# Patient Record
Sex: Female | Born: 2000 | Race: White | Hispanic: No | Marital: Single | Smoking: Never smoker
Health system: Southern US, Community
[De-identification: ages and names within clinical notes are randomized; demographics above are authoritative.]

## PROBLEM LIST (undated history)

## (undated) ENCOUNTER — Inpatient Hospital Stay (HOSPITAL_COMMUNITY): Payer: Self-pay

## (undated) DIAGNOSIS — F419 Anxiety disorder, unspecified: Secondary | ICD-10-CM

---
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
## 2020-04-12 ENCOUNTER — Ambulatory Visit: Payer: Self-pay

## 2020-04-14 ENCOUNTER — Ambulatory Visit: Payer: Medicaid Other | Attending: Internal Medicine

## 2020-04-14 DIAGNOSIS — Z23 Encounter for immunization: Secondary | ICD-10-CM

## 2020-04-14 NOTE — Progress Notes (Signed)
   [id number]  Name:  [facility] X.    MRN: [id number] DOB: [birth year]  04/14/2020  Brittany Roman was observed post Covid-19 immunization for 15 minutes without incident. She was provided with Vaccine Information Sheet and instruction to access the V-Safe system.   Brittany Roman was instructed to call 911 with any severe reactions post vaccine: Marland Kitchen Difficulty breathing  . Swelling of face and throat  . A fast heartbeat  . A bad rash all over body  . Dizziness and weakness   Immunizations Administered    Name Date Dose VIS Date Route   Pfizer COVID-19 Vaccine 04/14/2020 11:07 AM 0.3 mL 11/30/2018 Intramuscular   Manufacturer: [employer], [employer]   Lot: [id number]   NDC: [address]-8

## 2020-05-05 ENCOUNTER — Ambulatory Visit: Payer: Self-pay

## 2020-05-05 ENCOUNTER — Ambulatory Visit: Payer: Medicaid Other | Attending: Internal Medicine

## 2020-05-05 DIAGNOSIS — Z23 Encounter for immunization: Secondary | ICD-10-CM

## 2020-05-05 NOTE — Progress Notes (Signed)
   [id number]  Name:  [facility] X.    MRN: [id number] DOB: [birth year]  05/05/2020  [name] was observed post Covid-19 immunization for 15 minutes without incident. She was provided with Vaccine Information Sheet and instruction to access the V-Safe system.   [name] was instructed to call 911 with any severe reactions post vaccine: Marland Kitchen Difficulty breathing  . Swelling of face and throat  . A fast heartbeat  . A bad rash all over body  . Dizziness and weakness   Immunizations Administered    Name Date Dose VIS Date Route   Pfizer COVID-19 Vaccine 05/05/2020 11:08 AM 0.3 mL 11/30/2018 Intramuscular   Manufacturer: [employer], [employer]   Lot: [id number]   NDC: [address]-7

## 2021-12-01 ENCOUNTER — Ambulatory Visit (HOSPITAL_COMMUNITY): Admit: 2021-12-01 | Discharge status: Absent without leave | Payer: Self-pay

## 2021-12-01 ENCOUNTER — Ambulatory Visit (INDEPENDENT_AMBULATORY_CARE_PROVIDER_SITE_OTHER): Payer: BC Managed Care – PPO

## 2021-12-01 ENCOUNTER — Encounter (HOSPITAL_COMMUNITY): Payer: Self-pay

## 2021-12-01 ENCOUNTER — Other Ambulatory Visit: Payer: Self-pay

## 2021-12-01 ENCOUNTER — Ambulatory Visit (HOSPITAL_COMMUNITY)
Admission: EM | Admit: 2021-12-01 | Discharge: 2021-12-01 | Disposition: A | Discharge status: Home / Self Care | Payer: BC Managed Care – PPO | Attending: Family Medicine | Admitting: Family Medicine

## 2021-12-01 DIAGNOSIS — R0602 Shortness of breath: Secondary | ICD-10-CM | POA: Diagnosis not present

## 2021-12-01 DIAGNOSIS — R0600 Dyspnea, unspecified: Secondary | ICD-10-CM | POA: Diagnosis not present

## 2021-12-01 DIAGNOSIS — F419 Anxiety disorder, unspecified: Secondary | ICD-10-CM | POA: Diagnosis not present

## 2021-12-01 MED ORDER — BUSPIRONE HCL 5 MG PO TABS: 5.0000 mg | ORAL_TABLET | Freq: Every day | ORAL | 0 refills | Status: DC | PRN

## 2021-12-01 NOTE — Discharge Instructions (Addendum)
Chest x-ray was clear of any problem.  Buspirone 5 mg, 1 to 2 tablets daily as needed for anxiety.  I am glad you can see your primary care office this week, and they may start you on something different to take daily for anxiety.  I do think it would be better not to vape at all

## 2021-12-01 NOTE — ED Triage Notes (Signed)
Pt presents to the office for panic attacks and anxiety. She has a history of depression is on medication.She is worry about her breathing, as she uses vapes with her friends.  She has use hydroxyzine in the past but it makes her super sleepy.She is very tearful at this time.

## 2021-12-01 NOTE — ED Provider Notes (Signed)
[facility]    CSN: [id number] Arrival date & time: 12/01/21  1401      History   Chief Complaint Chief Complaint  Patient presents with   Anxiety    HPI Brittany Roman is a 21 y.o. female.    Anxiety  Here with recent trouble feeling like she is not getting good deep breath.  She feels that she has to concentrate on her breathing and you on a bunch to get a good deep breath.  She acknowledges that when she is busy at work and occupied, that she does not really note this.  No recent fever or chills, no cough or cold symptoms.  She has never had a history of asthma  She has been vaping a little bit more recently and she does note that that may increase her anxiety.  She has had anxiety and some panic attacks sometimes.  She already has an appointment for later this week with her primary care office  History reviewed. No pertinent past medical history.  There are no problems to display for this patient.   History reviewed. No pertinent surgical history.  OB History   No obstetric history on file.      Home Medications    Prior to Admission medications   Medication Sig Start Date End Date Taking? Authorizing Provider  busPIRone (BUSPAR) 5 MG tablet Take 1-2 tablets (5-10 mg total) by mouth daily as needed. 12/01/21  Yes Barrett Henle, MD    Family History History reviewed. No pertinent family history.  Social History Social History   Tobacco Use   Smoking status: Never     Allergies   Patient has no known allergies.   Review of Systems Review of Systems   Physical Exam Triage Vital Signs ED Triage Vitals  Enc Vitals Group     BP      Pulse      Resp      Temp      Temp src      SpO2      Weight      Height      Head Circumference      Peak Flow      Pain Score      Pain Loc      Pain Edu?      Excl. in [place]?    No data found.  Updated Vital Signs BP 99/67 (BP Location: Left Arm)    Pulse 75    Temp 98.1 F (36.7 C) (Oral)     Resp 16    LMP 12/01/2021 (Approximate)    SpO2 100%   Visual Acuity Right Eye Distance:   Left Eye Distance:   Bilateral Distance:    Right Eye Near:   Left Eye Near:    Bilateral Near:     Physical Exam Vitals reviewed.  Constitutional:      General: She is not in acute distress.    Appearance: She is not ill-appearing, toxic-appearing or diaphoretic.  HENT:     Mouth/Throat:     Mouth: Mucous membranes are moist.  Eyes:     Extraocular Movements: Extraocular movements intact.     Pupils: Pupils are equal, round, and reactive to light.  Cardiovascular:     Rate and Rhythm: Normal rate and regular rhythm.     Heart sounds: No murmur heard. Pulmonary:     Effort: Pulmonary effort is normal. No respiratory distress.     Breath sounds: Normal breath  sounds. No stridor. No wheezing, rhonchi or rales.  Musculoskeletal:     Cervical back: Neck supple.     Right lower leg: No edema.     Left lower leg: No edema.  Lymphadenopathy:     Cervical: No cervical adenopathy.  Skin:    Capillary Refill: Capillary refill takes less than 2 seconds.     Coloration: Skin is not jaundiced or pale.  Neurological:     General: No focal deficit present.     Mental Status: She is oriented to person, place, and time.  Psychiatric:     Comments: A little anxious and tearful in the exam room     UC Treatments / Results  Labs (all labs ordered are listed, but only abnormal results are displayed) Labs Reviewed - No data to display  EKG   Radiology DG Chest 2 View  Result Date: 12/01/2021 CLINICAL DATA:  Dyspnea.  Panic attacks, anxiety. EXAM: CHEST - 2 VIEW COMPARISON:  08/28/2006 FINDINGS: The heart size and mediastinal contours are within normal limits. Both lungs are clear. The visualized skeletal structures are unremarkable. IMPRESSION: No active cardiopulmonary disease. Electronically Signed   By: Nolon Nations M.D.   On: 12/01/2021 16:27    Procedures Procedures (including  critical care time)  Medications Ordered in UC Medications - No data to display  Initial Impression / Assessment and Plan / UC Course  I have reviewed the triage vital signs and the nursing notes.  Pertinent labs & imaging results that were available during my care of the patient were reviewed by me and considered in my medical decision making (see chart for details).     Chest x-ray ordered, as when I reassured her that her lung exam was normal, and asked if that was sufficient to reassure her, she asked if she could still do a chest x-ray   Chest x-ray is clear. Final Clinical Impressions(s) / UC Diagnoses   Final diagnoses:  Shortness of breath  Anxiety     Discharge Instructions      Chest x-ray was clear of any problem.  Buspirone 5 mg, 1 to 2 tablets daily as needed for anxiety.  I am glad you can see your primary care office this week, and they may start you on something different to take daily for anxiety.  I do think it would be better not to vape at all     ED Prescriptions     Medication Sig Dispense Auth. Provider   busPIRone (BUSPAR) 5 MG tablet Take 1-2 tablets (5-10 mg total) by mouth daily as needed. 20 tablet Bernard Donahoo, Gwenlyn Perking, MD      I have reviewed the PDMP during this encounter.   Barrett Henle, MD 12/01/21 [phone]

## 2021-12-02 ENCOUNTER — Encounter (HOSPITAL_COMMUNITY): Payer: Self-pay

## 2021-12-04 DIAGNOSIS — F332 Major depressive disorder, recurrent severe without psychotic features: Secondary | ICD-10-CM | POA: Diagnosis not present

## 2021-12-04 DIAGNOSIS — R4586 Emotional lability: Secondary | ICD-10-CM | POA: Diagnosis not present

## 2021-12-04 DIAGNOSIS — F411 Generalized anxiety disorder: Secondary | ICD-10-CM | POA: Diagnosis not present

## 2021-12-30 DIAGNOSIS — F319 Bipolar disorder, unspecified: Secondary | ICD-10-CM | POA: Diagnosis not present

## 2021-12-30 DIAGNOSIS — N760 Acute vaginitis: Secondary | ICD-10-CM | POA: Diagnosis not present

## 2021-12-30 DIAGNOSIS — R4586 Emotional lability: Secondary | ICD-10-CM | POA: Diagnosis not present

## 2021-12-30 DIAGNOSIS — F332 Major depressive disorder, recurrent severe without psychotic features: Secondary | ICD-10-CM | POA: Diagnosis not present

## 2022-01-07 DIAGNOSIS — F411 Generalized anxiety disorder: Secondary | ICD-10-CM | POA: Diagnosis not present

## 2022-01-07 DIAGNOSIS — F330 Major depressive disorder, recurrent, mild: Secondary | ICD-10-CM | POA: Diagnosis not present

## 2022-01-21 DIAGNOSIS — F411 Generalized anxiety disorder: Secondary | ICD-10-CM | POA: Diagnosis not present

## 2022-01-21 DIAGNOSIS — F330 Major depressive disorder, recurrent, mild: Secondary | ICD-10-CM | POA: Diagnosis not present

## 2022-01-28 DIAGNOSIS — F411 Generalized anxiety disorder: Secondary | ICD-10-CM | POA: Diagnosis not present

## 2022-01-28 DIAGNOSIS — F330 Major depressive disorder, recurrent, mild: Secondary | ICD-10-CM | POA: Diagnosis not present

## 2022-02-03 DIAGNOSIS — L700 Acne vulgaris: Secondary | ICD-10-CM | POA: Diagnosis not present

## 2022-02-04 DIAGNOSIS — F411 Generalized anxiety disorder: Secondary | ICD-10-CM | POA: Diagnosis not present

## 2022-02-04 DIAGNOSIS — F330 Major depressive disorder, recurrent, mild: Secondary | ICD-10-CM | POA: Diagnosis not present

## 2022-02-11 DIAGNOSIS — F330 Major depressive disorder, recurrent, mild: Secondary | ICD-10-CM | POA: Diagnosis not present

## 2022-02-11 DIAGNOSIS — F411 Generalized anxiety disorder: Secondary | ICD-10-CM | POA: Diagnosis not present

## 2022-02-18 DIAGNOSIS — F319 Bipolar disorder, unspecified: Secondary | ICD-10-CM | POA: Diagnosis not present

## 2022-02-18 DIAGNOSIS — N760 Acute vaginitis: Secondary | ICD-10-CM | POA: Diagnosis not present

## 2022-02-18 DIAGNOSIS — F332 Major depressive disorder, recurrent severe without psychotic features: Secondary | ICD-10-CM | POA: Diagnosis not present

## 2022-02-18 DIAGNOSIS — Z113 Encounter for screening for infections with a predominantly sexual mode of transmission: Secondary | ICD-10-CM | POA: Diagnosis not present

## 2022-02-20 DIAGNOSIS — R059 Cough, unspecified: Secondary | ICD-10-CM | POA: Diagnosis not present

## 2022-02-20 DIAGNOSIS — R051 Acute cough: Secondary | ICD-10-CM | POA: Diagnosis not present

## 2022-02-25 DIAGNOSIS — F411 Generalized anxiety disorder: Secondary | ICD-10-CM | POA: Diagnosis not present

## 2022-02-25 DIAGNOSIS — F330 Major depressive disorder, recurrent, mild: Secondary | ICD-10-CM | POA: Diagnosis not present

## 2022-03-11 DIAGNOSIS — F330 Major depressive disorder, recurrent, mild: Secondary | ICD-10-CM | POA: Diagnosis not present

## 2022-03-11 DIAGNOSIS — F411 Generalized anxiety disorder: Secondary | ICD-10-CM | POA: Diagnosis not present

## 2022-03-18 DIAGNOSIS — F411 Generalized anxiety disorder: Secondary | ICD-10-CM | POA: Diagnosis not present

## 2022-03-18 DIAGNOSIS — F330 Major depressive disorder, recurrent, mild: Secondary | ICD-10-CM | POA: Diagnosis not present

## 2022-03-25 DIAGNOSIS — F411 Generalized anxiety disorder: Secondary | ICD-10-CM | POA: Diagnosis not present

## 2022-03-25 DIAGNOSIS — F330 Major depressive disorder, recurrent, mild: Secondary | ICD-10-CM | POA: Diagnosis not present

## 2022-04-01 DIAGNOSIS — F330 Major depressive disorder, recurrent, mild: Secondary | ICD-10-CM | POA: Diagnosis not present

## 2022-04-01 DIAGNOSIS — F411 Generalized anxiety disorder: Secondary | ICD-10-CM | POA: Diagnosis not present

## 2022-04-22 DIAGNOSIS — F411 Generalized anxiety disorder: Secondary | ICD-10-CM | POA: Diagnosis not present

## 2022-04-22 DIAGNOSIS — F330 Major depressive disorder, recurrent, mild: Secondary | ICD-10-CM | POA: Diagnosis not present

## 2022-05-02 DIAGNOSIS — N926 Irregular menstruation, unspecified: Secondary | ICD-10-CM | POA: Diagnosis not present

## 2022-05-02 DIAGNOSIS — Z113 Encounter for screening for infections with a predominantly sexual mode of transmission: Secondary | ICD-10-CM | POA: Diagnosis not present

## 2022-05-06 DIAGNOSIS — F411 Generalized anxiety disorder: Secondary | ICD-10-CM | POA: Diagnosis not present

## 2022-05-06 DIAGNOSIS — F330 Major depressive disorder, recurrent, mild: Secondary | ICD-10-CM | POA: Diagnosis not present

## 2022-05-20 DIAGNOSIS — F411 Generalized anxiety disorder: Secondary | ICD-10-CM | POA: Diagnosis not present

## 2022-05-20 DIAGNOSIS — F330 Major depressive disorder, recurrent, mild: Secondary | ICD-10-CM | POA: Diagnosis not present

## 2022-07-03 DIAGNOSIS — Z01419 Encounter for gynecological examination (general) (routine) without abnormal findings: Secondary | ICD-10-CM | POA: Diagnosis not present

## 2022-07-03 DIAGNOSIS — N390 Urinary tract infection, site not specified: Secondary | ICD-10-CM | POA: Diagnosis not present

## 2022-07-03 DIAGNOSIS — Z124 Encounter for screening for malignant neoplasm of cervix: Secondary | ICD-10-CM | POA: Diagnosis not present

## 2022-07-03 DIAGNOSIS — R399 Unspecified symptoms and signs involving the genitourinary system: Secondary | ICD-10-CM | POA: Diagnosis not present

## 2022-07-03 DIAGNOSIS — R3915 Urgency of urination: Secondary | ICD-10-CM | POA: Diagnosis not present

## 2022-07-03 DIAGNOSIS — Z681 Body mass index (BMI) 19 or less, adult: Secondary | ICD-10-CM | POA: Diagnosis not present

## 2022-07-03 DIAGNOSIS — Z113 Encounter for screening for infections with a predominantly sexual mode of transmission: Secondary | ICD-10-CM | POA: Diagnosis not present

## 2022-07-08 DIAGNOSIS — F411 Generalized anxiety disorder: Secondary | ICD-10-CM | POA: Diagnosis not present

## 2022-07-08 DIAGNOSIS — F330 Major depressive disorder, recurrent, mild: Secondary | ICD-10-CM | POA: Diagnosis not present

## 2022-09-06 DIAGNOSIS — Z113 Encounter for screening for infections with a predominantly sexual mode of transmission: Secondary | ICD-10-CM | POA: Diagnosis not present

## 2022-09-18 DIAGNOSIS — D224 Melanocytic nevi of scalp and neck: Secondary | ICD-10-CM | POA: Diagnosis not present

## 2022-09-18 DIAGNOSIS — D485 Neoplasm of uncertain behavior of skin: Secondary | ICD-10-CM | POA: Diagnosis not present

## 2022-11-18 ENCOUNTER — Other Ambulatory Visit: Payer: Self-pay

## 2022-11-18 ENCOUNTER — Encounter (HOSPITAL_BASED_OUTPATIENT_CLINIC_OR_DEPARTMENT_OTHER): Payer: Self-pay

## 2022-11-18 ENCOUNTER — Emergency Department (HOSPITAL_BASED_OUTPATIENT_CLINIC_OR_DEPARTMENT_OTHER)
Admission: EM | Admit: 2022-11-18 | Discharge: 2022-11-18 | Disposition: A | Discharge status: Home / Self Care | Payer: BC Managed Care – PPO | Attending: Emergency Medicine | Admitting: Emergency Medicine

## 2022-11-18 ENCOUNTER — Emergency Department (HOSPITAL_BASED_OUTPATIENT_CLINIC_OR_DEPARTMENT_OTHER): Payer: BC Managed Care – PPO

## 2022-11-18 DIAGNOSIS — W19XXXA Unspecified fall, initial encounter: Secondary | ICD-10-CM

## 2022-11-18 DIAGNOSIS — W01198A Fall on same level from slipping, tripping and stumbling with subsequent striking against other object, initial encounter: Secondary | ICD-10-CM | POA: Insufficient documentation

## 2022-11-18 DIAGNOSIS — Y9301 Activity, walking, marching and hiking: Secondary | ICD-10-CM | POA: Diagnosis not present

## 2022-11-18 DIAGNOSIS — R519 Headache, unspecified: Secondary | ICD-10-CM | POA: Diagnosis not present

## 2022-11-18 DIAGNOSIS — S0081XA Abrasion of other part of head, initial encounter: Secondary | ICD-10-CM

## 2022-11-18 DIAGNOSIS — S0990XA Unspecified injury of head, initial encounter: Secondary | ICD-10-CM | POA: Diagnosis not present

## 2022-11-18 DIAGNOSIS — S01112A Laceration without foreign body of left eyelid and periocular area, initial encounter: Secondary | ICD-10-CM | POA: Insufficient documentation

## 2022-11-18 DIAGNOSIS — S0181XA Laceration without foreign body of other part of head, initial encounter: Secondary | ICD-10-CM

## 2022-11-18 DIAGNOSIS — S0083XA Contusion of other part of head, initial encounter: Secondary | ICD-10-CM | POA: Diagnosis not present

## 2022-11-18 MED ORDER — ACETAMINOPHEN 500 MG PO TABS
1000.0000 mg | ORAL_TABLET | Freq: Once | ORAL | Status: AC
  Administered 2022-11-18: 1000 mg via ORAL
  Filled 2022-11-18: qty 2

## 2022-11-18 MED ORDER — LIDOCAINE-EPINEPHRINE 2 %-1:200000 IJ SOLN
10.0000 mL | Freq: Once | INTRAMUSCULAR | Status: AC
Start: 2022-11-18 — End: 2022-11-18
  Administered 2022-11-18: 10 mL
  Filled 2022-11-18: qty 20

## 2022-11-18 NOTE — ED Notes (Signed)
Wound irrigated with NS and cleaned with wound cleanser.

## 2022-11-18 NOTE — ED Triage Notes (Addendum)
Patient here POV from Home.  Endorses Fall last PM. Patient states she is unsure how she fell. No LOC. No Anticoagulants. States she fell onto Concrete. Left Sided Facial Bruising and Swelling. States this occurred Last PM. No Neck Pain or C-Spine Tenderness.  Tearful in Triage. A&Ox4. GCS 15. Ambulatory.

## 2022-11-18 NOTE — ED Notes (Signed)
Pt given discharge instructions. Opportunities given for questions. Pt verbalizes understanding. Leanne Chang, RN

## 2022-11-18 NOTE — Discharge Instructions (Addendum)
Your CT imaging of the head and face was negative for acute intracranial bleeding, skull fracture or facial fracture.  You sustained a 1 cm laceration to the left eyebrow with associated abrasions to the left cheek.  He also sustained hematoma/bruising to the left eyelid and surrounding the left eye.  Your vision is intact.  Recommend cleaning your wounds with warm soapy water daily, can apply Neosporin or bacitracin ointment to the affected areas.  Your laceration was closed with absorbable sutures, these will fall out or be absorbed into the tissue slowly over time.  Recommend Tylenol and ibuprofen for pain control.

## 2022-11-18 NOTE — ED Provider Notes (Signed)
[facility] Provider Note   CSN: [id number] Arrival date & time: 11/18/22  1103     History  Chief Complaint  Patient presents with   Head Injury    Brittany Roman is a 22 y.o. female.   Head Injury    22 year old female with no significant medical history who presents to the emergency department after a fall.  The patient states that she had a drink with dinner and tripped while walking home and landed on her face on concrete.  She denies any loss of consciousness.  She is not on anticoagulation.  She sustained a facial abrasion and bruising around her left eye.  She states that her tetanus is up-to-date and last administration was documented as in 2020.  She denies any vision loss, blurry vision, eye pain or ocular foreign body sensation.  She denies being assaulted.  She states that she feels safe at home.  She denies any other injuries other than to the left side of her face.  She states that she did have 1 episode of NBNB emesis last night, none since.  She endorses pain around her left orbit.  Ice to the emergency department GCS 15, ABC intact complaining of moderate to severe left facial pain.  Home Medications Prior to Admission medications   Medication Sig Start Date End Date Taking? Authorizing Provider  busPIRone (BUSPAR) 5 MG tablet Take 1-2 tablets (5-10 mg total) by mouth daily as needed. 12/01/21   Barrett Henle, MD      Allergies    Patient has no known allergies.    Review of Systems   Review of Systems  All other systems reviewed and are negative.   Physical Exam Updated Vital Signs BP (!) 116/92 (BP Location: Right Arm)   Pulse 94   Temp 97.8 F (36.6 C)   Resp 18   Ht 5' 3"$  (1.6 m)   Wt 48.5 kg   SpO2 100%   BMI 18.95 kg/m  Physical Exam Vitals and nursing note reviewed.  Constitutional:      General: She is not in acute distress.    Appearance: She is well-developed.     Comments: GCS 15, ABC intact   HENT:     Head: Normocephalic.     Comments: Left-sided periorbital swelling, hematoma of the eyelid and periorbital hematoma noted, tenderness to palpation about the orbit along the lateral aspect of the left eye.   Left-sided facial/cheek abrasion present, hemostatic.   1cm laceration to the left eyebrow, hemostatic Eyes:     General: Vision grossly intact. Gaze aligned appropriately.     Extraocular Movements: Extraocular movements intact.     Conjunctiva/sclera: Conjunctivae normal.     Pupils: Pupils are equal, round, and reactive to light.  Neck:     Comments: No midline tenderness to palpation of the cervical spine.  Range of motion intact Cardiovascular:     Rate and Rhythm: Normal rate and regular rhythm.  Pulmonary:     Effort: Pulmonary effort is normal. No respiratory distress.     Breath sounds: Normal breath sounds.  Chest:     Comments: Clavicles stable nontender to AP compression.  Chest wall stable and nontender to AP and lateral compression. Abdominal:     Palpations: Abdomen is soft.     Tenderness: There is no abdominal tenderness.     Comments: Pelvis stable to lateral compression  Musculoskeletal:     Cervical back: Neck supple.  Comments: No midline tenderness to palpation of the thoracic or lumbar spine.  Extremities atraumatic with intact range of motion  Skin:    General: Skin is warm and dry.  Neurological:     Mental Status: She is alert.     Comments: Cranial nerves II through XII grossly intact.  Moving all 4 extremities spontaneously.  Sensation grossly intact all 4 extremities     ED Results / Procedures / Treatments   Labs (all labs ordered are listed, but only abnormal results are displayed) Labs Reviewed  PREGNANCY, URINE    EKG None  Radiology CT Head Wo Contrast  Result Date: 11/18/2022 CLINICAL DATA:  Fall with facial bruising and swelling as well as head pain. EXAM: CT HEAD WITHOUT CONTRAST CT MAXILLOFACIAL WITHOUT CONTRAST  TECHNIQUE: Multidetector CT imaging of the head and maxillofacial structures were performed using the standard protocol without intravenous contrast. Multiplanar CT image reconstructions of the maxillofacial structures were also generated. RADIATION DOSE REDUCTION: This exam was performed according to the departmental dose-optimization program which includes automated exposure control, adjustment of the mA and/or kV according to patient size and/or use of iterative reconstruction technique. COMPARISON:  None Available. FINDINGS: CT HEAD FINDINGS Brain: No evidence of acute infarction, hemorrhage, hydrocephalus, extra-axial collection or mass lesion/mass effect. Vascular: No hyperdense vessel or unexpected calcification. Skull: Normal. Negative for fracture or focal lesion. Other: None. CT MAXILLOFACIAL FINDINGS Osseous: No fracture or mandibular dislocation. No destructive process. Orbits: Negative. No traumatic or inflammatory finding. Sinuses: There is a small amount of left maxillary sinus disease. Soft tissues: There is a small amount of soft tissue swelling overlying the left aspect of the face. IMPRESSION: 1. No acute intracranial process. 2. No acute facial bone fracture. Electronically Signed   By: Zerita Boers M.D.   On: 11/18/2022 12:01   CT Maxillofacial Wo Contrast  Result Date: 11/18/2022 CLINICAL DATA:  Fall with facial bruising and swelling as well as head pain. EXAM: CT HEAD WITHOUT CONTRAST CT MAXILLOFACIAL WITHOUT CONTRAST TECHNIQUE: Multidetector CT imaging of the head and maxillofacial structures were performed using the standard protocol without intravenous contrast. Multiplanar CT image reconstructions of the maxillofacial structures were also generated. RADIATION DOSE REDUCTION: This exam was performed according to the departmental dose-optimization program which includes automated exposure control, adjustment of the mA and/or kV according to patient size and/or use of iterative  reconstruction technique. COMPARISON:  None Available. FINDINGS: CT HEAD FINDINGS Brain: No evidence of acute infarction, hemorrhage, hydrocephalus, extra-axial collection or mass lesion/mass effect. Vascular: No hyperdense vessel or unexpected calcification. Skull: Normal. Negative for fracture or focal lesion. Other: None. CT MAXILLOFACIAL FINDINGS Osseous: No fracture or mandibular dislocation. No destructive process. Orbits: Negative. No traumatic or inflammatory finding. Sinuses: There is a small amount of left maxillary sinus disease. Soft tissues: There is a small amount of soft tissue swelling overlying the left aspect of the face. IMPRESSION: 1. No acute intracranial process. 2. No acute facial bone fracture. Electronically Signed   By: Zerita Boers M.D.   On: 11/18/2022 12:01    Procedures .Marland KitchenLaceration Repair  Date/Time: 11/18/2022 1:10 PM  Performed by: Regan Lemming, MD Authorized by: Regan Lemming, MD   Consent:    Consent obtained:  Verbal   Consent given by:  Patient   Risks discussed:  Infection, poor cosmetic result, need for additional repair and pain   Alternatives discussed:  No treatment Universal protocol:    Patient identity confirmed:  Verbally with patient Anesthesia:  Anesthesia method:  Local infiltration   Local anesthetic:  Lidocaine 1% WITH epi Laceration details:    Location:  Face   Face location:  L eyebrow   Length (cm):  1   Depth (mm):  2 Pre-procedure details:    Preparation:  Imaging obtained to evaluate for foreign bodies Exploration:    Imaging outcome: foreign body not noted   Treatment:    Area cleansed with:  Saline   Amount of cleaning:  Standard   Irrigation solution:  Sterile saline Skin repair:    Repair method:  Sutures   Suture size:  5-0   Wound skin closure material used: Vicryl Rapide.   Suture technique:  Simple interrupted   Number of sutures:  3 Approximation:    Approximation:  Close Repair type:    Repair type:   Simple Post-procedure details:    Dressing:  Open (no dressing)   Procedure completion:  Tolerated     Medications Ordered in ED Medications  lidocaine-EPINEPHrine (XYLOCAINE W/EPI) 2 %-1:200000 (PF) injection 10 mL (has no administration in time range)  acetaminophen (TYLENOL) tablet 1,000 mg (1,000 mg Oral Given 11/18/22 1153)    ED Course/ Medical Decision Making/ A&P                             Medical Decision Making Amount and/or Complexity of Data Reviewed Labs: ordered. Radiology: ordered.  Risk OTC drugs.    22 year old female with no significant medical history who presents to the emergency department after a fall.  The patient states that she had a drink with dinner and tripped while walking home and landed on her face on concrete.  She denies any loss of consciousness.  She is not on anticoagulation.  She sustained a facial abrasion and bruising around her left eye.  She states that her tetanus is up-to-date and last administration was documented as in 2020.  She denies any vision loss, blurry vision, eye pain or ocular foreign body sensation.  She denies being assaulted.  She states that she feels safe at home.  She denies any other injuries other than to the left side of her face.  She states that she did have 1 episode of NBNB emesis last night, none since.  She endorses pain around her left orbit.  Ice to the emergency department GCS 15, ABC intact complaining of moderate to severe left facial pain.  On arrival, the patient was vitally stable, presenting with left-sided facial trauma after a ground-level mechanical fall landing onto concrete.  1 episode of NBNB emesis.  Patient is since returned to her baseline neurologic status.  Her C-spine was cleared by Nexus criteria.  Her tetanus is up-to-date.  Her wounds were cleaned by nursing staff bedside.  CT imaging of the head and face were performed and results indicate: IMPRESSION:  1. No acute intracranial process.  2.  No acute facial bone fracture.    Her facial wounds were cleaned by nursing staff bedside.  She was provided with wound care instructions. She sustained a small 1cm laceration to the left eyebrow.  This was repaired with 5-0 Vicryl rapide sutures per the procedure note above.  Patient was provided with wound care instructions and infectious return precautions.  Final Clinical Impression(s) / ED Diagnoses Final diagnoses:  Fall, initial encounter  Abrasion of face, initial encounter  Facial laceration, initial encounter    Rx / DC Orders ED Discharge Orders  None         Regan Lemming, MD 11/18/22 1315

## 2023-02-24 DIAGNOSIS — N898 Other specified noninflammatory disorders of vagina: Secondary | ICD-10-CM | POA: Diagnosis not present

## 2023-02-24 DIAGNOSIS — N760 Acute vaginitis: Secondary | ICD-10-CM | POA: Diagnosis not present

## 2023-02-24 DIAGNOSIS — Z113 Encounter for screening for infections with a predominantly sexual mode of transmission: Secondary | ICD-10-CM | POA: Diagnosis not present

## 2023-02-24 DIAGNOSIS — B9689 Other specified bacterial agents as the cause of diseases classified elsewhere: Secondary | ICD-10-CM | POA: Diagnosis not present

## 2023-02-24 DIAGNOSIS — Z7251 High risk heterosexual behavior: Secondary | ICD-10-CM | POA: Diagnosis not present

## 2023-03-03 IMAGING — DX DG CHEST 2V
2 series · 2 of 2 positions shown · non-contrast
Comparison: 08/28/2006

CLINICAL DATA: Dyspnea.  Panic attacks, anxiety.

EXAM:
CHEST - 2 VIEW

[chest pa]
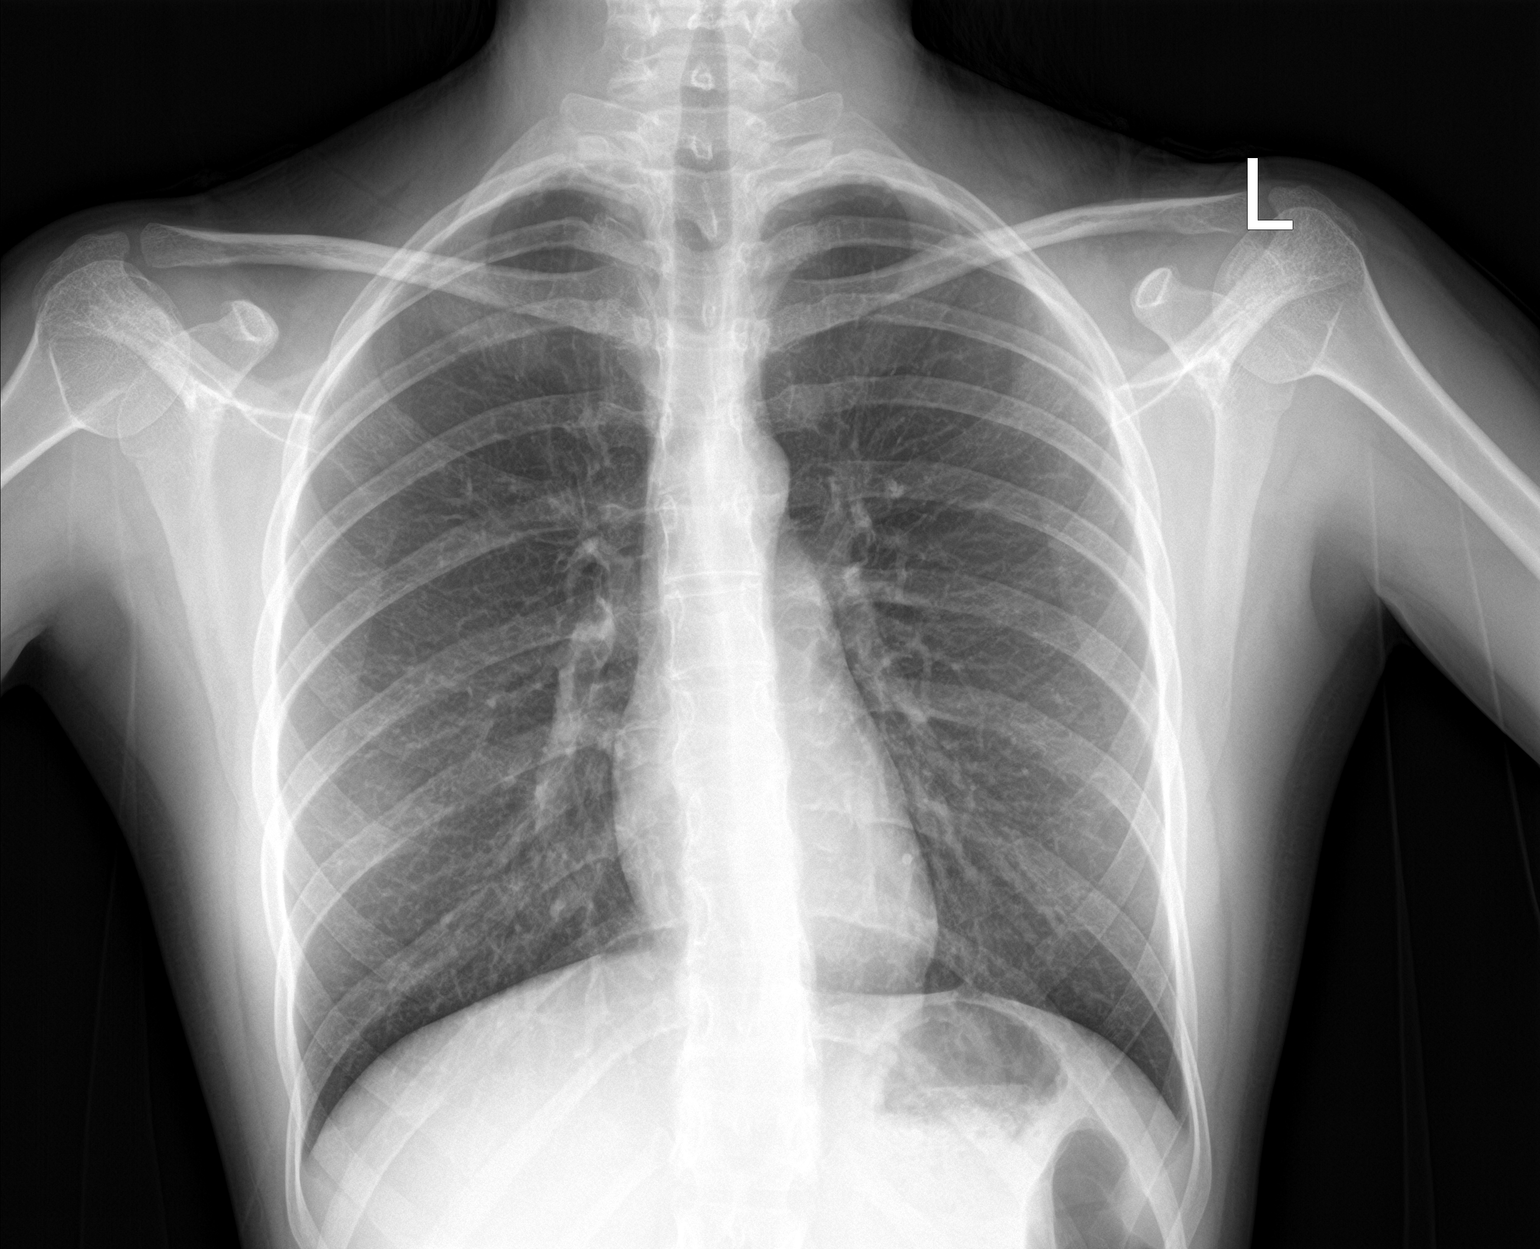

[chest lat]
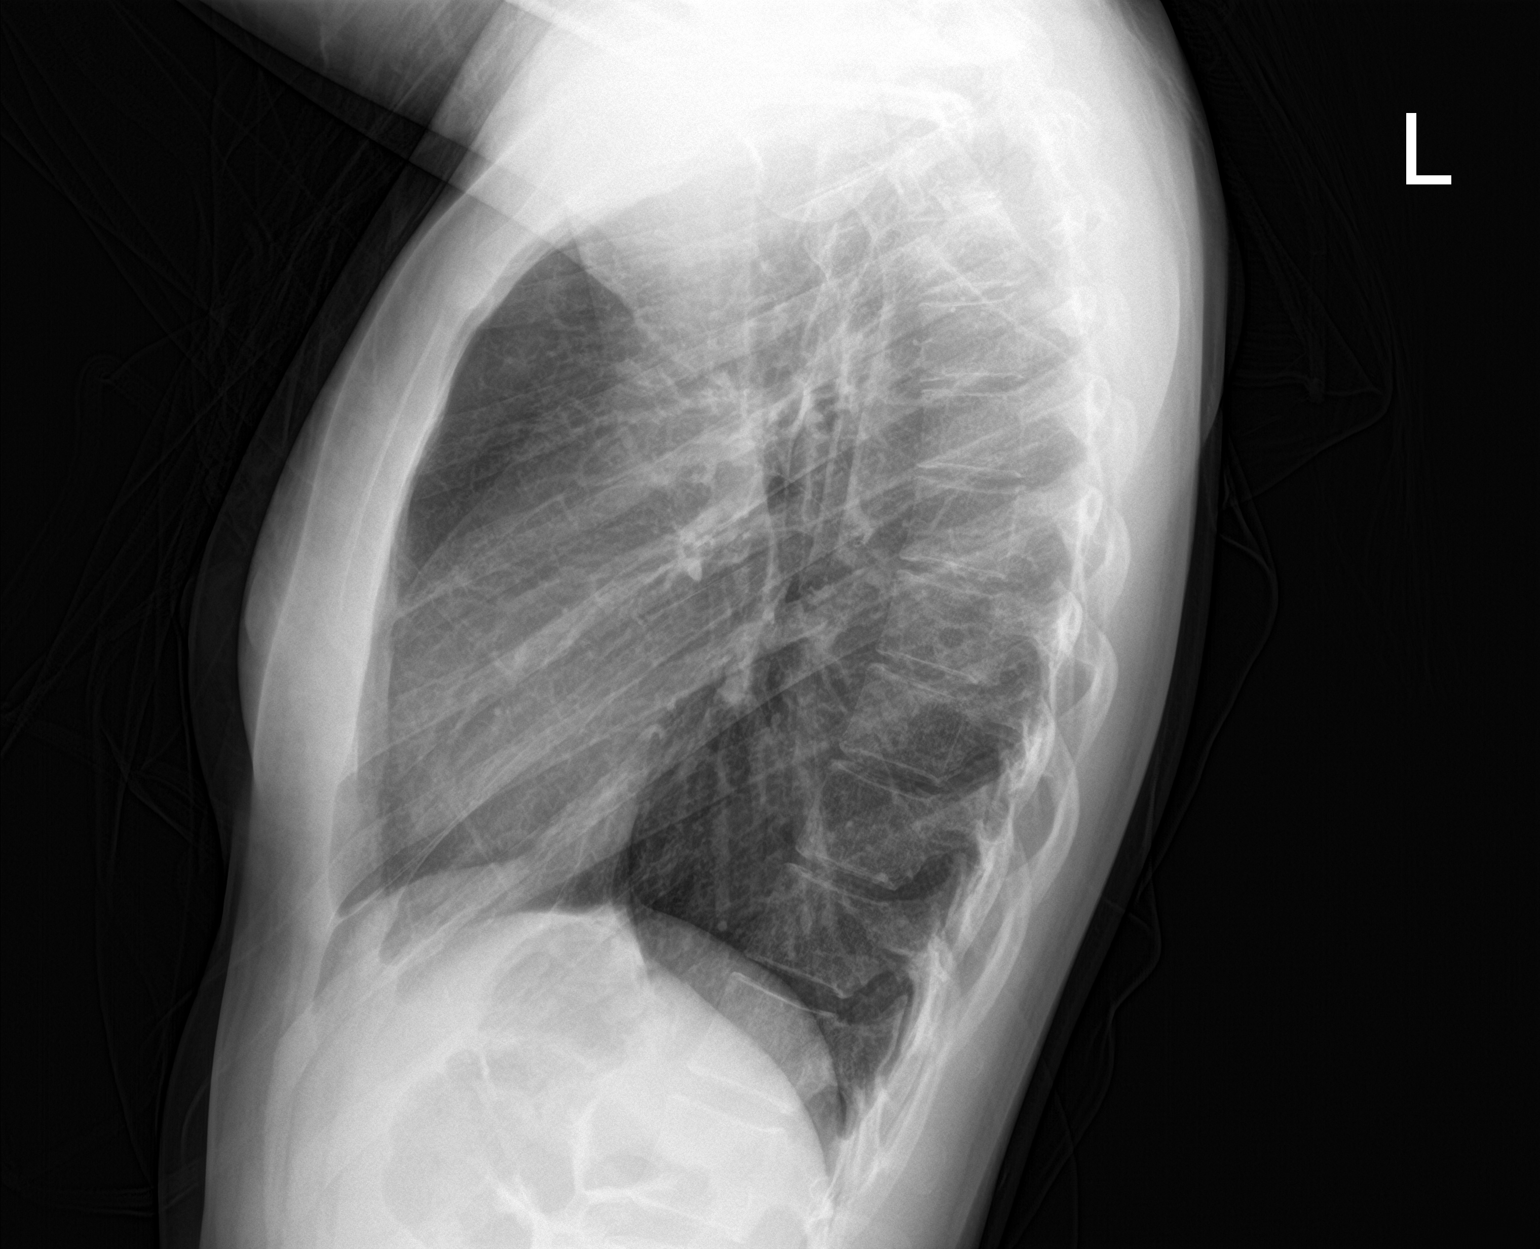

[2 of 2 positions shown; findings below may reference images not displayed]

FINDINGS: The heart size and mediastinal contours are within normal limits.
Both lungs are clear. The visualized skeletal structures are
unremarkable.
IMPRESSION: No active cardiopulmonary disease.

## 2023-04-05 DIAGNOSIS — N3001 Acute cystitis with hematuria: Secondary | ICD-10-CM | POA: Diagnosis not present

## 2023-07-20 DIAGNOSIS — Z113 Encounter for screening for infections with a predominantly sexual mode of transmission: Secondary | ICD-10-CM | POA: Diagnosis not present

## 2023-07-20 DIAGNOSIS — Z1330 Encounter for screening examination for mental health and behavioral disorders, unspecified: Secondary | ICD-10-CM | POA: Diagnosis not present

## 2023-07-20 DIAGNOSIS — Z01419 Encounter for gynecological examination (general) (routine) without abnormal findings: Secondary | ICD-10-CM | POA: Diagnosis not present

## 2023-07-20 DIAGNOSIS — R87612 Low grade squamous intraepithelial lesion on cytologic smear of cervix (LGSIL): Secondary | ICD-10-CM | POA: Diagnosis not present

## 2023-08-24 DIAGNOSIS — R87611 Atypical squamous cells cannot exclude high grade squamous intraepithelial lesion on cytologic smear of cervix (ASC-H): Secondary | ICD-10-CM | POA: Diagnosis not present

## 2023-10-07 NOTE — L&D Delivery Note (Signed)
 [facility] Delivery Note  Brittany Roman is a 23 y.o. G2P0010 s/p SVD at [redacted]w[redacted]d. She was admitted for SOL.   ROM: 3h 72m with clear fluid GBS Status: Negative/-- (10/01 1020)  Maximum Maternal Temperature: 100.2  Labor Progress: Initial SVE: 2/60/-2. She then progressed to complete.   Delivery Date/Time: 08/07/2024 2:39 PM Delivery: Called to room and patient was complete and pushing. Head delivered straight occiput anterior. nuchal cord absent. Shoulder and body delivered in usual fashion. Infant with spontaneous cry, placed on mother's abdomen, dried and stimulated. Cord clamped x 2 after 1-minute delay, and cut by patient's mother. Cord blood drawn. Placenta delivered spontaneously with gentle cord traction. Fundus firm with massage and Pitocin. Labia, perineum, vagina, and cervix inspected and repaired.  Placenta:  spontaneous Intact Complications:maternal fever 100.47F after delivery, no abx given Lacerations: 1st degree and labial QBL: Analgesia: Epidural  Newborn Data Living status:Living Gender:Female Apgars:8 ,9  Weight:3490 g           Barabara Maier, DO [facility]

## 2023-10-21 ENCOUNTER — Emergency Department (HOSPITAL_BASED_OUTPATIENT_CLINIC_OR_DEPARTMENT_OTHER)
Admission: EM | Admit: 2023-10-21 | Discharge: 2023-10-21 | Discharge status: Against Medical Advice | Payer: BC Managed Care – PPO | Attending: Emergency Medicine | Admitting: Emergency Medicine

## 2023-10-21 ENCOUNTER — Encounter (HOSPITAL_BASED_OUTPATIENT_CLINIC_OR_DEPARTMENT_OTHER): Payer: Self-pay | Admitting: *Deleted

## 2023-10-21 ENCOUNTER — Inpatient Hospital Stay (HOSPITAL_COMMUNITY)
Admission: AD | Admit: 2023-10-21 | Discharge: 2023-10-21 | Disposition: A | Discharge status: Home / Self Care | Payer: BC Managed Care – PPO | Attending: Obstetrics & Gynecology | Admitting: Obstetrics & Gynecology

## 2023-10-21 ENCOUNTER — Encounter (HOSPITAL_COMMUNITY): Payer: Self-pay | Admitting: Obstetrics & Gynecology

## 2023-10-21 ENCOUNTER — Other Ambulatory Visit: Payer: Self-pay

## 2023-10-21 DIAGNOSIS — Z3A00 Weeks of gestation of pregnancy not specified: Secondary | ICD-10-CM | POA: Insufficient documentation

## 2023-10-21 DIAGNOSIS — O3680X0 Pregnancy with inconclusive fetal viability, not applicable or unspecified: Secondary | ICD-10-CM | POA: Insufficient documentation

## 2023-10-21 DIAGNOSIS — O209 Hemorrhage in early pregnancy, unspecified: Secondary | ICD-10-CM | POA: Diagnosis not present

## 2023-10-21 DIAGNOSIS — N939 Abnormal uterine and vaginal bleeding, unspecified: Secondary | ICD-10-CM

## 2023-10-21 DIAGNOSIS — R109 Unspecified abdominal pain: Secondary | ICD-10-CM | POA: Insufficient documentation

## 2023-10-21 DIAGNOSIS — R102 Pelvic and perineal pain: Secondary | ICD-10-CM

## 2023-10-21 DIAGNOSIS — Z5321 Procedure and treatment not carried out due to patient leaving prior to being seen by health care provider: Secondary | ICD-10-CM | POA: Insufficient documentation

## 2023-10-21 DIAGNOSIS — O26899 Other specified pregnancy related conditions, unspecified trimester: Secondary | ICD-10-CM | POA: Insufficient documentation

## 2023-10-21 DIAGNOSIS — O26891 Other specified pregnancy related conditions, first trimester: Secondary | ICD-10-CM | POA: Diagnosis not present

## 2023-10-21 HISTORY — DX: Anxiety disorder, unspecified: F41.9

## 2023-10-21 LAB — CBC
HCT: 38.5 % (ref 36.0–46.0)
Hemoglobin: 13 g/dL (ref 12.0–15.0)
MCH: 30.3 pg (ref 26.0–34.0)
MCHC: 33.8 g/dL (ref 30.0–36.0)
MCV: 89.7 fL (ref 80.0–100.0)
Platelets: 312 10*3/uL (ref 150–400)
RBC: 4.29 MIL/uL (ref 3.87–5.11)
RDW: 11.9 % (ref 11.5–15.5)
WBC: 7.9 10*3/uL (ref 4.0–10.5)
nRBC: 0 % (ref 0.0–0.2)

## 2023-10-21 LAB — POCT PREGNANCY, URINE: Preg Test, Ur: NEGATIVE

## 2023-10-21 LAB — PREGNANCY, URINE: Preg Test, Ur: NEGATIVE

## 2023-10-21 LAB — HCG, QUANTITATIVE, PREGNANCY: hCG, Beta Chain, Quant, S: 6 m[IU]/mL — ABNORMAL HIGH (ref <5)

## 2023-10-21 MED ORDER — PREPLUS 27-1 MG PO TABS: 1.0000 | ORAL_TABLET | Freq: Every day | ORAL | 13 refills | Status: DC

## 2023-10-21 NOTE — MAU Provider Note (Signed)
VB, cramping  S Ms. Macaylah Bater is a 23 y.o. G1P0 pregnant female at Unknown who presents to MAU today with complaint of VB and cramping earlier today. She states the VB is just spotting on toilet paper and cramping is very light.  However she states over two week ago she had 3 positive HPTs.  Had no concerns until the bleeding today.  Pt appears to be a great historian.   Pertinent items noted in HPI and remainder of comprehensive ROS otherwise negative.   O BP (!) 102/55 (BP Location: Right Arm)   Pulse 70   Temp 97.9 F (36.6 C) (Oral)   Resp 18   Ht 5\' 3"  (1.6 m)   Wt 46.4 kg   LMP 09/12/2023 (Exact Date)   SpO2 100%   Breastfeeding Unknown   BMI 18.12 kg/m  Physical Exam Vitals and nursing note reviewed.  Constitutional:      General: She is not in acute distress.    Appearance: She is well-developed and normal weight. She is not ill-appearing.  HENT:     Head: Normocephalic and atraumatic.  Eyes:     Extraocular Movements: Extraocular movements intact.  Cardiovascular:     Rate and Rhythm: Normal rate.  Pulmonary:     Effort: Pulmonary effort is normal. No respiratory distress.  Abdominal:     General: Abdomen is flat. There is no distension.     Palpations: Abdomen is soft.     Tenderness: There is no abdominal tenderness.  Skin:    General: Skin is warm and dry.  Neurological:     Mental Status: She is alert and oriented to person, place, and time.     Motor: No weakness.  Psychiatric:        Mood and Affect: Mood is anxious.        Behavior: Behavior normal.      MDM: low  MAU Course:  Upreg negative with serum bHCG only 6.  Discussed with patient hormone level too low to see on [place] and that it is concerning that positive Upregs over 2 weeks ago with now serum only showing [redacted] week gestation.  We stated that we still cannot r/o miscarriage vs early pregnancy of unknown location.  Pt was understanding and agreeable with plan to d/c with strict bleeding and pain  return precautions with repeat serum levels on 1/17 anytime after 1900.  They have no other questions or concerns at this time.  Pt stable for d/c.   A/P #Pregnancy of unknown location - concern for miscarriage vs. Early pregnancy  - continue prenatal vitamins  - return 1/17 after 1900 for repeat beta-hCG  - pelvic rest 72hrs   Discharge from MAU in stable condition with strict/usual precautions Follow up at desired OBGYN as scheduled for ongoing prenatal care  Allergies as of 10/21/2023   No Known Allergies      Medication List     TAKE these medications    busPIRone 5 MG tablet Commonly known as: BUSPAR Take 1-2 tablets (5-10 mg total) by mouth daily as needed.   PrePLUS 27-1 MG Tabs Take 1 tablet by mouth daily.        Hessie Dibble, MD 10/21/2023 7:03 PM

## 2023-10-21 NOTE — ED Notes (Signed)
 Additional tubes sent to lab.  Urine cup given and pt advised that urine sample is needed

## 2023-10-21 NOTE — ED Triage Notes (Signed)
 Pt reports that she had a postive pregnancy test last week and today she had some mild cramping and some vaginal bleeding which she states was spotting  Pt reports LMP was 12/7.  Pt reports this is her first pregancy, no distress in triage.  Slight cramping (less than period cramping).

## 2023-10-21 NOTE — MAU Note (Signed)
 Brittany Roman is a 23 y.o. at Unknown here in MAU reporting: she had three positive HPT and began having VB and cramping that began today @ 1300.  Reports VB noted with wiping, not wearing pad/panty liner.  LMP: 09/12/2023 Onset of complaint: today Pain score: 2 Vitals:   10/21/23 1725  BP: (!) 102/55  Pulse: 70  Resp: 18  Temp: 97.9 F (36.6 C)  SpO2: 100%     FHT:NA Lab orders placed from triage: UPT

## 2023-10-22 ENCOUNTER — Inpatient Hospital Stay (HOSPITAL_COMMUNITY)
Admission: AD | Admit: 2023-10-22 | Discharge: 2023-10-22 | Disposition: A | Discharge status: Home / Self Care | Payer: BC Managed Care – PPO | Attending: Obstetrics and Gynecology | Admitting: Obstetrics and Gynecology

## 2023-10-22 DIAGNOSIS — O039 Complete or unspecified spontaneous abortion without complication: Secondary | ICD-10-CM | POA: Insufficient documentation

## 2023-10-22 DIAGNOSIS — Z3202 Encounter for pregnancy test, result negative: Secondary | ICD-10-CM | POA: Diagnosis not present

## 2023-10-22 LAB — ABO/RH: ABO/RH(D): A POS

## 2023-10-22 LAB — HCG, QUANTITATIVE, PREGNANCY: hCG, Beta Chain, Quant, S: 4 m[IU]/mL (ref <5)

## 2023-10-22 NOTE — MAU Provider Note (Signed)
History   Chief Complaint:  Vaginal Bleeding   Brittany Roman is a 23 y.o. G1P0 at Unknown who presents for labs. Reports positive UPT 3 weeks ago. Had vaginal bleeding the last few days. Presented to MAU yesterday & HCG was 6. Reports increase in bleeding today with some clots & cramping. Not saturating pads. Has been wearing panty liners.   Physical Exam   Blood pressure 107/66, pulse 80, temperature 98 F (36.7 C), resp. rate 18, last menstrual period 09/12/2023, unknown if currently breastfeeding.  Physical Examination: General appearance - alert, well appearing, and in no distress Mental status - alert, oriented to person, place, and time Eyes - pupils equal and reactive, extraocular eye movements intact, sclera anicteric Chest - normal respiratory effort  Labs: Results for orders placed or performed during the hospital encounter of 10/22/23 (from the past 24 hours)  hCG, quantitative, pregnancy   Collection Time: 10/22/23 11:00 AM  Result Value Ref Range   hCG, Beta Chain, Quant, S 4 <5 mIU/mL  ABO/Rh   Collection Time: 10/22/23  1:17 PM  Result Value Ref Range   ABO/RH(D) A POS    No rh immune globuloin      NOT A RH IMMUNE GLOBULIN CANDIDATE, PT RH POSITIVE Performed at [facility], [address], [place], [place] [address]   Results for orders placed or performed during the hospital encounter of 10/21/23 (from the past 24 hours)  Pregnancy, urine POC   Collection Time: 10/21/23  5:02 PM  Result Value Ref Range   Preg Test, Ur NEGATIVE NEGATIVE  hCG, quantitative, pregnancy   Collection Time: 10/21/23  5:42 PM  Result Value Ref Range   hCG, Beta Chain, Quant, S 6 (H) <5 mIU/mL    Ultrasound Studies:   No results found.  Assessment:   1. Negative pregnancy test   2. Early pregnancy loss     -HCG now negative. Discussed this is likely early pregnancy loss. She has f/u with [facility] later this month.  -RH positive   Plan: -Discharge home  in stable condition -Bleeding precautions discussed -Patient advised to follow-up with [facility]   Brittany Horn, NP 10/22/2023, 2:17 PM

## 2023-10-22 NOTE — MAU Note (Signed)
.  Brittany Roman is a 23 y.o. at Unknown here in MAU reporting: reports increased bleeding saturated a panty liner and mild cramping .  LMP:  Onset of complaint: today Pain score: 1 Vitals:   10/22/23 1044  BP: 107/66  Pulse: 80  Resp: 18  Temp: 98 F (36.7 C)     FHT:n/a Lab orders placed from triage: BHCG

## 2023-11-11 DIAGNOSIS — Z13220 Encounter for screening for lipoid disorders: Secondary | ICD-10-CM | POA: Diagnosis not present

## 2023-11-11 DIAGNOSIS — Z0000 Encounter for general adult medical examination without abnormal findings: Secondary | ICD-10-CM | POA: Diagnosis not present

## 2023-11-11 DIAGNOSIS — Z23 Encounter for immunization: Secondary | ICD-10-CM | POA: Diagnosis not present

## 2023-12-07 ENCOUNTER — Other Ambulatory Visit: Payer: Self-pay

## 2023-12-07 ENCOUNTER — Ambulatory Visit (INDEPENDENT_AMBULATORY_CARE_PROVIDER_SITE_OTHER): Payer: BC Managed Care – PPO | Admitting: *Deleted

## 2023-12-07 ENCOUNTER — Encounter: Payer: Self-pay | Admitting: *Deleted

## 2023-12-07 VITALS — BP 110/68 | HR 82 | Ht 63.0 in | Wt 105.0 lb

## 2023-12-07 DIAGNOSIS — Z3687 Encounter for antenatal screening for uncertain dates: Secondary | ICD-10-CM

## 2023-12-07 DIAGNOSIS — O3680X0 Pregnancy with inconclusive fetal viability, not applicable or unspecified: Secondary | ICD-10-CM

## 2023-12-07 DIAGNOSIS — Z3201 Encounter for pregnancy test, result positive: Secondary | ICD-10-CM | POA: Diagnosis not present

## 2023-12-07 LAB — POCT PREGNANCY, URINE: Preg Test, Ur: POSITIVE — AB

## 2023-12-07 NOTE — Patient Instructions (Addendum)
 Prenatal Care Providers           Center for [facility] @ [facility]  [address] [phone]  Center for [facility] @ [facility]   [address]  [phone]  Center For [facility] @ [facility]       [address] [phone]            Center for [facility] @ [place]     [phone]          Center for [facility] @ [facility]   [address] [phone]  Center for [facility] @ [facility]  [address] [phone]     Center for [facility] @ [address] [place] Ace)  [address]   [phone]     [facility]  Phone: [phone]  [facility]  Phone: [phone]  [facility] Phone: [phone]  [facility] Phone: [phone]  [facility] Phone: [phone]  [facility] Phone: [phone]  [facility]  Phone: [phone]   Eating Plan for Pregnant Women While you are pregnant, your body requires additional nutrition to help support your growing baby. You also have a higher need for some vitamins and minerals, such as folic acid, calcium, iron, and vitamin D. Eating a healthy, well-balanced diet is very important for your health and your baby's health. Your need for extra calories varies over the course of your pregnancy. Pregnancy is divided into three trimesters, with each trimester lasting 3 months. For most women, it is recommended to consume: 150 extra calories a day during the first trimester. 300 extra calories a day during the second trimester. 300 extra calories a day during the third trimester. What are tips for following this plan? Cooking Practice good food safety and cleanliness. Wash your hands before you eat and after you prepare raw meat. Wash all fruits and vegetables well before peeling or  eating. Taking these actions can help to prevent foodborne illnesses that can be very dangerous to your baby, such as listeriosis. Ask your health care provider for more information about listeriosis. Make sure that all meats, poultry, and eggs are cooked to food-safe temperatures or "well-done." Meal planning  Eat a variety of foods (especially fruits and vegetables) to get a full range of vitamins and minerals. Two or more servings of fish are recommended each week in order to get the most benefits from omega-3 fatty acids that are found in seafood. Choose fish that are lower in mercury, such as salmon and pollock. Limit your overall intake of foods that have "empty calories." These are foods that have little nutritional value, such as sweets, desserts, candies, and sugar-sweetened beverages. Drinks that contain caffeine are okay to drink, but it is better to avoid caffeine. Keep your total caffeine intake to less than 200 mg each day (which is 12 oz or 355 mL of coffee, tea, or soda) or the limit as told by your health care provider. General information Do not try to lose weight or go on a diet during pregnancy. Take a prenatal vitamin to help meet your additional vitamin and mineral needs during pregnancy, specifically for folic acid, iron, calcium, and vitamin D. Remember to stay active. Ask your health care provider what types of exercise and activities are safe for you. What does 150 extra calories look like? Healthy options that provide 150 extra  calories each day could be any of the following: 6-8 oz (170-227 g) plain low-fat yogurt with  cup (70 g) berries. 1 apple with 2 tsp (11 g) peanut butter. Cut-up vegetables with  cup (60 g) hummus. 8 fl oz (237 mL) low-fat chocolate milk. 1 stick of string cheese with 1 medium orange. 1 peanut butter and jelly sandwich that is made with one slice of whole-wheat bread and 1 tsp (5 g) of peanut butter. For 300 extra calories, you could eat two  of these healthy options each day. What is a healthy amount of weight to gain? The right amount of weight gain for you is based on your BMI (body mass index) before you became pregnant. If your BMI was less than 18 (underweight), you should gain 28-40 lb (13-18 kg). If your BMI was 18-24.9 (normal), you should gain 25-35 lb (11-16 kg). If your BMI was 25-29.9 (overweight), you should gain 15-25 lb (7-11 kg). If your BMI was 30 or greater (obese), you should gain 11-20 lb (5-9 kg). What if I am having twins or multiples? Generally, if you are carrying twins or multiples: You may need to eat 300-600 extra calories a day. The recommended range for total weight gain is 25-54 lb (11-25 kg), depending on your BMI before pregnancy. Talk with your health care provider to find out about nutritional needs, weight gain, and exercise that is right for you. What foods should I eat?  Fruits All fruits. Eat a variety of colors and types of fruit. Remember to wash your fruits well before peeling or eating. Vegetables All vegetables. Eat a variety of colors and types of vegetables. Remember to wash your vegetables well before peeling or eating. Grains All grains. Choose whole grains, such as whole-wheat bread, oatmeal, or Antonetti rice. Meats and other protein foods Lean meats, including chicken, [place], and lean cuts of beef, veal, or pork. Fish that is higher in omega-3 fatty acids and lower in mercury, such as salmon, herring, mussels, trout, sardines, pollock, shrimp, crab, and lobster. Tofu. Tempeh. Beans. Eggs. Peanut butter and other nut butters. Dairy Pasteurized milk and milk alternatives, such as almond milk. Pasteurized yogurt and pasteurized cheese. Cottage cheese. Sour cream. Beverages Water. Juices that contain 100% fruit juice or vegetable juice. Caffeine-free teas and decaffeinated coffee. Fats and oils Fats and oils are okay to include in moderation. Sweets and desserts Sweets and desserts  are okay to include in moderation. Seasoning and other foods All pasteurized condiments. The items listed above may not be a complete list of foods and beverages you can eat. Contact a dietitian for more information. What foods should I avoid? Fruits Raw (unpasteurized) fruit juices. Vegetables Unpasteurized vegetable juices. Meats and other protein foods Precooked or cured meat, such as bologna, hot dogs, sausages, or meat loaves. (If you must eat those meats, reheat them until they are steaming hot.) Refrigerated pate, meat spreads from a meat counter, or smoked seafood that is found in the refrigerated section of a store. Raw or undercooked meats, poultry, and eggs. Raw fish, such as sushi or sashimi. Fish that have high mercury content, such as tilefish, shark, swordfish, and king mackerel. Dairy Unpasteurized milk and any foods that have unpasteurized milk in them. Soft cheeses, such as feta, queso blanco, queso fresco, [place], [place], panela, and blue-veined cheeses (unless they are made with pasteurized milk, which must be stated on the label). Beverages Alcohol. Sugar-sweetened beverages, such as sodas, teas, or energy drinks. Seasoning and other foods  Homemade fermented foods and drinks, such as pickles, sauerkraut, or kombucha drinks. (Store-bought pasteurized versions of these are okay.) Salads that are made in a store or deli, such as ham salad, chicken salad, egg salad, tuna salad, and seafood salad. The items listed above may not be a complete list of foods and beverages you should avoid. Contact a dietitian for more information. Where to find more information To calculate the number of calories you need based on your height, weight, and activity level, you can use an online calculator such as: PayStrike.dk To calculate how much weight you should gain during pregnancy, you can use an online pregnancy weight gain calculator such as: http://www.harvey.com/ To learn more  about eating fish during pregnancy, talk with your health care provider or visit: PumpkinSearch.com.ee Summary While you are pregnant, your body requires additional nutrition to help support your growing baby. Eat a variety of foods, especially fruits and vegetables, to get a full range of vitamins and minerals. Practice good food safety and cleanliness. Wash your hands before you eat and after you prepare raw meat. Wash all fruits and vegetables well before peeling or eating. Taking these actions can help to prevent foodborne illnesses, such as listeriosis, that can be very dangerous to your baby. Do not eat raw meat or fish. Do not eat fish that have high mercury content, such as tilefish, shark, swordfish, and king mackerel. Do not eat raw (unpasteurized) dairy. Take a prenatal vitamin to help meet your additional vitamin and mineral needs during pregnancy, specifically for folic acid, iron, calcium, and vitamin D. This information is not intended to replace advice given to you by your health care provider. Make sure you discuss any questions you have with your health care provider. Document Revised: 04/17/2020 Document Reviewed: 04/19/2020 Elsevier Patient Education  2024 Elsevier Inc.Exercise During Pregnancy Exercise is an important part of being healthy for people of all ages. Exercise helps your heart and lungs work well. Exercise also: Helps you stay strong and flexible. Helps you keep a healthy body weight. Boosts your energy levels and improves your mood. You should try to exercise regularly during pregnancy. Exercise routines may need to change later in your pregnancy. In rare cases, certain medical problems in your pregnancy may limit the exercise you can do during pregnancy. Your health care provider will give you information on what exercises will work for you. How does exercise help during pregnancy? Along with staying strong and flexible, exercising during pregnancy can help: Keep  strength in muscles that are used during labor and birth. Control weight gain. Speed up your recovery after giving birth. Reduce the need for insulin if you get diabetes during pregnancy. Decrease low back pain. Lower the risk for depression. Lower the risk of cesarean delivery. Treat trouble pooping (constipation).  How does exercise affect my baby? Exercise can help you have a healthy pregnancy. Exercise does not cause your baby to be born early. It will not cause your baby to weigh less at birth. What exercises can I do? Many exercises are safe for you to do during pregnancy. Do a variety of exercises that safely increase your heart and breathing rates and help you build and maintain muscle strength. Do exercises as told by your provider. Your provider may recommend: Walking. Swimming. Water aerobics. Riding a stationary bike. Modified yoga or Pilates. Tell your instructor that you're pregnant. Avoid overstretching. Avoid lying on your back for long periods of time. Resistance exercises with weights or elastic bands. Running or  jogging. Choose this type of exercise only if: You ran or jogged regularly before your pregnancy. You can run or jog and still talk in full sentences. What exercises should I avoid? You may be told to limit high-intensity exercise depending on your level of fitness and if you exercised regularly before you became pregnant. You can tell that you're exercising at a high intensity if you're breathing much harder and faster and can't hold a conversation while exercising. You may be told to: Avoid jogging or running, unless you jogged or ran regularly before you became pregnant. Do not run or jog so fast that you're unable to have a conversation. Avoid activities that put you at risk for falling on your belly or getting hit in the belly. Some of these are: Downhill skiing. Rock climbing. Cycling and gymnastics. Horseback riding. Surfing and waterskiing. Contact  sports. Avoid scuba diving. Avoid skydiving. Avoid activities that take place in a room that's heated to high temperatures, such as hot yoga or hot Pilates. How do I exercise in a safe way?  Start slowly. Ask your provider to recommend the types of exercise that are safe for you. Avoid overheating. Do not exercise in very high temperatures or hot rooms. Avoid hot yoga or hot Pilates. Avoid standing still or lying flat on your back as much as you can. Avoid losing too much fluid (dehydration). Drink more fluids as told. Drink before, during, and after you exercise. Avoid overstretching. Because of hormone changes during pregnancy, it's easy to overstretch muscles, tendons, and ligaments. Ligaments are the tissues that connect bones to each other. Do not exercise to lose weight. Do not exercise at more than 6,000 feet above sea level (high elevation) if you don't live at that elevation. Tips and recommendations Wear loose-fitting, breathable clothes. Wear a sports bra to support your breasts. Exercise on most days or all days of the week. Try to exercise for 30 minutes a day, 5 days a week. If problems come up during your pregnancy, you provider may tell you to limit some exercises or to exercise less. If you have concerns, ask your provider. If you actively exercised before your pregnancy, your provider may tell you to continue to do moderate-intensity to high-intensity exercise. If you're just starting to exercise or didn't exercise much before your pregnancy, your provider may tell you to do low-intensity to moderate-intensity exercise. Questions to ask your health care provider Is exercise safe for me? What are signs that I should stop exercising? Does my health condition mean that I should not exercise during pregnancy? When should I avoid exercising during pregnancy? Stop exercising and contact a health care provider if: You have any unusual symptoms, such as: Mild contractions or  cramps in the belly. Dizziness that does not go away when you rest. Headache. Pain and swelling of your calves. Bleeding or fluid leaking from your vagina. Stop exercising and get help right away if: You have: Chest pain. Shortness of breath. Sudden, severe pain in your low back or your belly. Regular, painful contractions before 37 weeks of pregnancy. These symptoms may be an emergency. Call 911 right away. Do not wait to see if the symptoms will go away. Do not drive yourself to the hospital. This information is not intended to replace advice given to you by your health care provider. Make sure you discuss any questions you have with your health care provider. Document Revised: 05/18/2023 Document Reviewed: 05/18/2023 Elsevier Patient Education  2024 [employer].

## 2023-12-07 NOTE — Progress Notes (Signed)
 Here for pregnancy test which was positive. She reports SAB in January on 15th with negative blood test on October 22, 2023. She was seen by our providers in [place]. She did not have another period since then.  She has had unprotected intercourse as they hope to be pregnant. She has not had any other pregnancies. She reports no health issues. She reports having tender breasts, no nausea yet. Offered and accepted dating [place]. Reviewed medications and bleeding precautions with her. Discussed prenatal choices with her and she thinks she will go to one of our offices but will decide. Patient had questions about diet , exercise- information placed in checkout. Explained once she received results of her dating [place] she can call or message to sign up for prenatal care. She voices understanding. Sent to desk to check out. Nancy Fetter

## 2023-12-14 ENCOUNTER — Encounter: Payer: Self-pay | Admitting: *Deleted

## 2023-12-14 ENCOUNTER — Ambulatory Visit (INDEPENDENT_AMBULATORY_CARE_PROVIDER_SITE_OTHER)

## 2023-12-14 ENCOUNTER — Telehealth: Payer: Self-pay | Admitting: Family Medicine

## 2023-12-14 DIAGNOSIS — Z3687 Encounter for antenatal screening for uncertain dates: Secondary | ICD-10-CM | POA: Diagnosis not present

## 2023-12-14 DIAGNOSIS — Z3491 Encounter for supervision of normal pregnancy, unspecified, first trimester: Secondary | ICD-10-CM

## 2023-12-14 DIAGNOSIS — Z3A01 Less than 8 weeks gestation of pregnancy: Secondary | ICD-10-CM | POA: Diagnosis not present

## 2023-12-14 DIAGNOSIS — O3680X0 Pregnancy with inconclusive fetal viability, not applicable or unspecified: Secondary | ICD-10-CM | POA: Diagnosis not present

## 2023-12-14 NOTE — Telephone Encounter (Signed)
 Patient has questions about mychart results and would like a call from the nurse and has anxiety about results

## 2023-12-14 NOTE — Progress Notes (Signed)
 Patient here for [place] and expressed anxiety over result notes in computer to front office. They placed her in serenity room. I spoke with her and she showed me on her phone that her pregnancy urine tested showed positive and that this is abnormal. I explained default is negative so a positive urine test just shows as abnormal.  She then showed me a link that takes her to information about HIV testing. I pulled up her chart in EPIC on our computer and showed her no HIV testing was done, only urine pregnancy test and that I can't explain why link takes her to HIV testing information unless it is just indicating that is routine testing that will be done when she has ob labs. She expresses relief and thanked me for talking with her. Nancy Fetter

## 2023-12-16 NOTE — Telephone Encounter (Signed)
 Called pt and pt informed me that her concerns has been addressed and that she was waiting for someone to call her to schedule an appointment.  I advised pt that they will call her to schedule appt.    Leonette Nutting  12/16/23

## 2024-01-13 ENCOUNTER — Other Ambulatory Visit (HOSPITAL_COMMUNITY)
Admission: RE | Admit: 2024-01-13 | Discharge: 2024-01-13 | Disposition: A | Discharge status: Home / Self Care | Source: Ambulatory Visit | Attending: Family Medicine | Admitting: Family Medicine

## 2024-01-13 ENCOUNTER — Ambulatory Visit (INDEPENDENT_AMBULATORY_CARE_PROVIDER_SITE_OTHER)

## 2024-01-13 ENCOUNTER — Other Ambulatory Visit: Payer: Self-pay

## 2024-01-13 ENCOUNTER — Encounter: Payer: Self-pay | Admitting: *Deleted

## 2024-01-13 VITALS — BP 109/69 | HR 99 | Wt 104.0 lb

## 2024-01-13 DIAGNOSIS — Z31430 Encounter of female for testing for genetic disease carrier status for procreative management: Secondary | ICD-10-CM | POA: Diagnosis not present

## 2024-01-13 DIAGNOSIS — Z3480 Encounter for supervision of other normal pregnancy, unspecified trimester: Secondary | ICD-10-CM | POA: Insufficient documentation

## 2024-01-13 DIAGNOSIS — Z3490 Encounter for supervision of normal pregnancy, unspecified, unspecified trimester: Secondary | ICD-10-CM | POA: Insufficient documentation

## 2024-01-13 DIAGNOSIS — Z3491 Encounter for supervision of normal pregnancy, unspecified, first trimester: Secondary | ICD-10-CM | POA: Diagnosis not present

## 2024-01-13 DIAGNOSIS — Z3481 Encounter for supervision of other normal pregnancy, first trimester: Secondary | ICD-10-CM

## 2024-01-13 DIAGNOSIS — Z3A11 11 weeks gestation of pregnancy: Secondary | ICD-10-CM

## 2024-01-13 MED ORDER — GOJJI WEIGHT SCALE MISC: 1.0000 | 0 refills | Status: DC | PRN

## 2024-01-13 MED ORDER — BLOOD PRESSURE KIT DEVI: 1.0000 | 0 refills | Status: DC | PRN

## 2024-01-13 NOTE — Progress Notes (Signed)
 New OB Intake  Patient is here In person for her New OB Intake.  I discussed the limitations, risks, security and privacy concerns of performing an evaluation and management service by telephone and the availability of in person appointments. I also discussed with the patient that there may be a patient responsible charge related to this service. The patient expressed understanding and agreed to proceed.  I explained I am completing New OB Intake today. We discussed EDD of 08/03/2024, by Ultrasound. Pt is G2P0010. I reviewed her allergies, medications and Medical/Surgical/OB history.    Patient Active Problem List   Diagnosis Date Noted   Supervision of other normal pregnancy, antepartum 01/13/2024    Concerns addressed today  Delivery Plans Plans to deliver at [facility]. Discussed the nature of our practice with multiple providers including residents and students. Due to the size of the practice, the delivering provider may not be the same as those providing prenatal care.   Patient is interested in water birth. Offered upcoming OB visit with CNM to discuss further.  MyChart/Babyscripts MyChart access verified. I explained pt will have some visits in office and some virtually. Babyscripts instructions given and order placed. Patient verifies receipt of registration text/e-mail. Account successfully created and app downloaded. If patient is a candidate for Optimized scheduling, add to sticky note.   Blood Pressure Cuff/Weight Scale Blood pressure cuff ordered for patient to pick-up from [employer]. Explained after first prenatal appt pt will check weekly and document in Babyscripts. Patient does not have weight scale; order sent to [facility], patient may track weight weekly in Babyscripts.  Anatomy US Explained first scheduled [place] will be around 19 weeks. Anatomy US scheduled for 03/09/2024 at 8:15AM.  Is patient a CenteringPregnancy candidate?  Declined Declined due to  Schedule    Is patient a Mom+Baby Combined Care candidate?  Declined   If accepted, confirm patient does not intend to move from the area for at least 12 months, then notify Mom+Baby staff  Interested in [place]? If yes, send referral and doula dot phrase.   Is patient a candidate for Babyscripts Optimization? Yes, patient declined   First visit review I reviewed new OB appt with patient. Explained pt will be seen by Scheryl Darter MD at first visit. Discussed Avelina Laine genetic screening with patient. Yes [facility] and [facility].. Routine prenatal labs is   Last Pap Had pap smear done at [facility], will sigh an ROI to get the result.   Vidal Schwalbe, [place] 01/13/2024  10:32 AM

## 2024-01-14 LAB — CBC/D/PLT+RPR+RH+ABO+RUBIGG...
Antibody Screen: NEGATIVE
Basophils Absolute: 0 10*3/uL (ref 0.0–0.2)
Basos: 0 %
EOS (ABSOLUTE): 0.1 10*3/uL (ref 0.0–0.4)
Eos: 1 %
HCV Ab: NONREACTIVE
HIV Screen 4th Generation wRfx: NONREACTIVE
Hematocrit: 34.8 % (ref 34.0–46.6)
Hemoglobin: 12 g/dL (ref 11.1–15.9)
Hepatitis B Surface Ag: NEGATIVE
Immature Grans (Abs): 0.1 10*3/uL (ref 0.0–0.1)
Immature Granulocytes: 1 %
Lymphocytes Absolute: 1.7 10*3/uL (ref 0.7–3.1)
Lymphs: 22 %
MCH: 31.1 pg (ref 26.6–33.0)
MCHC: 34.5 g/dL (ref 31.5–35.7)
MCV: 90 fL (ref 79–97)
Monocytes Absolute: 0.4 10*3/uL (ref 0.1–0.9)
Monocytes: 5 %
Neutrophils Absolute: 5.5 10*3/uL (ref 1.4–7.0)
Neutrophils: 71 %
Platelets: 269 10*3/uL (ref 150–450)
RBC: 3.86 x10E6/uL (ref 3.77–5.28)
RDW: 11.8 % (ref 11.7–15.4)
RPR Ser Ql: NONREACTIVE
Rh Factor: POSITIVE
Rubella Antibodies, IGG: <0.9 {index} — ABNORMAL LOW (ref >0.99)
WBC: 7.7 10*3/uL (ref 3.4–10.8)

## 2024-01-14 LAB — GC/CHLAMYDIA PROBE AMP (~~LOC~~) NOT AT ARMC
Chlamydia: NEGATIVE
Comment: NEGATIVE
Comment: NORMAL
Neisseria Gonorrhea: NEGATIVE

## 2024-01-14 LAB — HEMOGLOBIN A1C
Est. average glucose Bld gHb Est-mCnc: 108 mg/dL
Hgb A1c MFr Bld: 5.4 % (ref 4.8–5.6)

## 2024-01-14 LAB — HCV INTERPRETATION

## 2024-01-16 LAB — URINE CULTURE, OB REFLEX

## 2024-01-16 LAB — CULTURE, OB URINE

## 2024-01-20 ENCOUNTER — Other Ambulatory Visit: Payer: Self-pay

## 2024-01-20 ENCOUNTER — Ambulatory Visit: Payer: Self-pay | Admitting: Obstetrics & Gynecology

## 2024-01-20 ENCOUNTER — Encounter: Payer: Self-pay | Admitting: Obstetrics & Gynecology

## 2024-01-20 VITALS — BP 112/64 | HR 89 | Wt 102.5 lb

## 2024-01-20 DIAGNOSIS — Z3481 Encounter for supervision of other normal pregnancy, first trimester: Secondary | ICD-10-CM

## 2024-01-20 DIAGNOSIS — Z3A12 12 weeks gestation of pregnancy: Secondary | ICD-10-CM | POA: Diagnosis not present

## 2024-01-20 DIAGNOSIS — Z3480 Encounter for supervision of other normal pregnancy, unspecified trimester: Secondary | ICD-10-CM

## 2024-01-20 NOTE — Progress Notes (Signed)
  Subjective:nausea has resolved    Brittany Roman is a G2P0010 [redacted]w[redacted]d being seen today for her first obstetrical visit.  Her obstetrical history is significant for  h/o SAB . Patient does intend to breast feed. Pregnancy history fully reviewed.  Patient reports no complaints.  Vitals:   01/20/24 1008  BP: 112/64  Pulse: 89  Weight: 102 lb 8 oz (46.5 kg)    HISTORY: OB History  Gravida Para Term Preterm AB Living  2    1   SAB IAB Ectopic Multiple Live Births  1        # Outcome Date GA Lbr Len/2nd Weight Sex Type Anes PTL Lv  2 Current           1 SAB 2025           Past Medical History:  Diagnosis Date   Anxiety    History reviewed. No pertinent surgical history. History reviewed. No pertinent family history.   Exam    Uterus:   12 week  Pelvic Exam: Pelvic is not indicated    Perineum:    Vulva:    Vagina:     pH:    Cervix:    Adnexa:    Bony Pelvis:   System: Breast:     Skin: normal coloration and turgor, no rashes    Neurologic: oriented, normal mood   Extremities: normal strength, tone, and muscle mass   HEENT PERRLA and neck supple with midline trachea   Mouth/Teeth mucous membranes moist, pharynx normal without lesions   Neck supple   Cardiovascular: regular rate and rhythm, no murmurs or gallops   Respiratory:  appears well, vitals normal, no respiratory distress, acyanotic, normal RR, chest clear, no wheezing, crepitations, rhonchi, normal symmetric air entry   Abdomen: soft, non-tender; bowel sounds normal; no masses,  no organomegaly   Urinary:       Assessment:    Pregnancy: G2P0010 Patient Active Problem List   Diagnosis Date Noted   Supervision of other normal pregnancy, antepartum 01/13/2024        Plan:     Initial labs drawn. Prenatal vitamins. Problem list reviewed and updated. Genetic Screening discussed : ordered.  Ultrasound discussed; fetal survey: ordered.  Follow up in 4 weeks. 50% of 30 min visit spent on  counseling and coordination of care.  Lonne Roan of [facility] practice discussed   Onnie Bilis 01/20/2024

## 2024-01-21 ENCOUNTER — Encounter: Payer: Self-pay | Admitting: Obstetrics & Gynecology

## 2024-01-21 LAB — PANORAMA PRENATAL TEST FULL PANEL:PANORAMA TEST PLUS 5 ADDITIONAL MICRODELETIONS

## 2024-01-21 NOTE — Telephone Encounter (Signed)
 Patient came into office today to request lab results. Reviewed need for Panorama redraw. [facility] representative Loris Ros will be in touch with patient to arrange redraw. Also, patient rubella non-immune and will need vaccine postpartum. Note left in patient chart for review of this at next appointment.

## 2024-01-25 DIAGNOSIS — Z3490 Encounter for supervision of normal pregnancy, unspecified, unspecified trimester: Secondary | ICD-10-CM | POA: Diagnosis not present

## 2024-02-04 LAB — HORIZON CUSTOM: REPORT SUMMARY: NEGATIVE

## 2024-02-19 ENCOUNTER — Ambulatory Visit: Admitting: Obstetrics and Gynecology

## 2024-02-19 ENCOUNTER — Other Ambulatory Visit: Payer: Self-pay

## 2024-02-19 VITALS — BP 106/64 | HR 105 | Wt 105.2 lb

## 2024-02-19 DIAGNOSIS — Z2839 Other underimmunization status: Secondary | ICD-10-CM | POA: Insufficient documentation

## 2024-02-19 DIAGNOSIS — O09892 Supervision of other high risk pregnancies, second trimester: Secondary | ICD-10-CM | POA: Diagnosis not present

## 2024-02-19 DIAGNOSIS — Z3A16 16 weeks gestation of pregnancy: Secondary | ICD-10-CM

## 2024-02-19 DIAGNOSIS — Z3490 Encounter for supervision of normal pregnancy, unspecified, unspecified trimester: Secondary | ICD-10-CM | POA: Insufficient documentation

## 2024-02-19 DIAGNOSIS — O09899 Supervision of other high risk pregnancies, unspecified trimester: Secondary | ICD-10-CM | POA: Insufficient documentation

## 2024-02-19 DIAGNOSIS — Z3480 Encounter for supervision of other normal pregnancy, unspecified trimester: Secondary | ICD-10-CM

## 2024-02-19 NOTE — Progress Notes (Signed)
   PRENATAL VISIT NOTE  Subjective:  Brittany Roman is a 23 y.o. G2P0010 at [redacted]w[redacted]d being seen today for ongoing prenatal care.  She is currently monitored for the following issues for this low-risk pregnancy and has Supervision of other normal pregnancy, antepartum and Rubella non-immune status, antepartum on their problem list.  Patient doing well with no acute concerns today. She reports no complaints.   . Vag. Bleeding: None.   . Denies leaking of fluid.   The following portions of the patient's history were reviewed and updated as appropriate: allergies, current medications, past family history, past medical history, past social history, past surgical history and problem list. Problem list updated.  Objective:   Vitals:   02/19/24 1116  BP: 106/64  Pulse: (!) 105  Weight: 105 lb 3.2 oz (47.7 kg)    Fetal Status: Fetal Heart Rate (bpm): 154 Fundal Height: 16 cm       General:  Alert, oriented and cooperative. Patient is in no acute distress.  Skin: Skin is warm and dry. No rash noted.   Cardiovascular: Normal heart rate noted  Respiratory: Normal respiratory effort, no problems with respiration noted  Abdomen: Soft, gravid, appropriate for gestational age.  Pain/Pressure: Absent     Pelvic: Cervical exam deferred        Extremities: Normal range of motion.     Mental Status:  Normal mood and affect. Normal behavior. Normal judgment and thought content.   Assessment and Plan:  Pregnancy: G2P0010 at 107w2d  1. Supervision of other normal pregnancy, antepartum (Primary) Continue routine prenatal care Info given regarding potential waterbirth  - AFP, Serum, Open Spina Bifida  2. [redacted] weeks gestation of pregnancy   3. Rubella non-immune status, antepartum Treat after delivery  Preterm labor symptoms and general obstetric precautions including but not limited to vaginal bleeding, contractions, leaking of fluid and fetal movement were reviewed in detail with the patient.  Please  refer to After Visit Summary for other counseling recommendations.   Return in about 4 weeks (around 03/18/2024) for ROB, in person.   Avie Boeck, MD Faculty Attending Center for [facility]

## 2024-02-19 NOTE — Patient Instructions (Signed)
   Considering Waterbirth? Guide for patients at Center for [facility]) Why consider waterbirth? Gentle birth for babies  Less pain medicine used in labor  May allow for passive descent/less pushing  May reduce perineal tears  More mobility and instinctive maternal position changes  Increased maternal relaxation   Is waterbirth safe? What are the risks of infection, drowning or other complications? Infection:  Very low risk (3.7 % for tub vs 4.8% for bed)  7 in 8000 waterbirths with documented infection  Poorly cleaned equipment most common cause  Slightly lower group B strep transmission rate  Drowning  Maternal:  Very low risk  Related to seizures or fainting  Newborn:  Very low risk. No evidence of increased risk of respiratory problems in multiple large studies  Physiological protection from breathing under water  Avoid underwater birth if there are any fetal complications  Once baby's head is out of the water, keep it out.  Birth complication  Some reports of cord trauma, but risk decreased by bringing baby to surface gradually  No evidence of increased risk of shoulder dystocia. Mothers can usually change positions faster in water than in a bed, possibly aiding the maneuvers to free the shoulder.   There are 2 things you MUST do to have a waterbirth with [facility]: Attend a waterbirth class at [facility] at [facility]   3rd Wednesday of every month from 7-9 pm (virtual during COVID) [employer] at [email or web address] or [email or web address] or by calling [phone] Bring [place] the certificate from the class to your prenatal appointment or send via MyChart Meet with a midwife at 36 weeks* to see if you can still plan a waterbirth and to sign the consent.   *We also recommend that you schedule as many of your prenatal visits with a midwife as possible.    Helpful information: You may want to bring a bathing suit top to the hospital  to wear during labor but this is optional.  All other supplies are provided by the hospital. Please arrive at the hospital with signs of active labor, and do not wait at home until late in labor. It takes 45 min- 1 hour for fetal monitoring, and check in to your room to take place, plus transport and filling of the waterbirth tub.    Things that would prevent you from having a waterbirth: Premature, <37wks  Previous cesarean birth  Presence of thick meconium-stained fluid  Multiple gestation (Twins, triplets, etc.)  Uncontrolled diabetes or gestational diabetes requiring medication  Hypertension diagnosed in pregnancy or preexisting hypertension (gestational hypertension, preeclampsia, or chronic hypertension) Fetal growth restriction (your baby measures less than 10th percentile on ultrasound) Heavy vaginal bleeding  Non-reassuring fetal heart rate  Active infection (MRSA, etc.). Group B Strep is NOT a contraindication for waterbirth.  If your labor has to be induced and induction method requires continuous monitoring of the baby's heart rate  Other risks/issues identified by your obstetrical provider   Please remember that birth is unpredictable. Under certain unforeseeable circumstances your provider may advise against giving birth in the tub. These decisions will be made on a case-by-case basis and with the safety of you and your baby as our highest priority.    Updated 01/08/22

## 2024-02-21 LAB — AFP, SERUM, OPEN SPINA BIFIDA
AFP MoM: 0.92
AFP Value: 38.8 ng/mL
Gest. Age on Collection Date: 16 wk
Maternal Age At EDD: 24.1 a
OSBR Risk 1 IN: 10000
Test Results:: NEGATIVE
Weight: 105 [lb_av]

## 2024-02-24 ENCOUNTER — Ambulatory Visit: Payer: Self-pay | Admitting: Obstetrics and Gynecology

## 2024-02-24 DIAGNOSIS — Z3480 Encounter for supervision of other normal pregnancy, unspecified trimester: Secondary | ICD-10-CM

## 2024-03-03 ENCOUNTER — Telehealth: Payer: Self-pay

## 2024-03-07 ENCOUNTER — Ambulatory Visit

## 2024-03-09 ENCOUNTER — Ambulatory Visit (HOSPITAL_BASED_OUTPATIENT_CLINIC_OR_DEPARTMENT_OTHER): Admitting: Maternal & Fetal Medicine

## 2024-03-09 ENCOUNTER — Ambulatory Visit: Attending: Obstetrics & Gynecology

## 2024-03-09 VITALS — BP 105/62 | HR 80

## 2024-03-09 DIAGNOSIS — Z3689 Encounter for other specified antenatal screening: Secondary | ICD-10-CM

## 2024-03-09 DIAGNOSIS — O2692 Pregnancy related conditions, unspecified, second trimester: Secondary | ICD-10-CM | POA: Insufficient documentation

## 2024-03-09 DIAGNOSIS — Z3480 Encounter for supervision of other normal pregnancy, unspecified trimester: Secondary | ICD-10-CM

## 2024-03-09 DIAGNOSIS — Z3A19 19 weeks gestation of pregnancy: Secondary | ICD-10-CM | POA: Insufficient documentation

## 2024-03-09 DIAGNOSIS — Z363 Encounter for antenatal screening for malformations: Secondary | ICD-10-CM | POA: Diagnosis not present

## 2024-03-09 DIAGNOSIS — G930 Cerebral cysts: Secondary | ICD-10-CM | POA: Insufficient documentation

## 2024-03-09 NOTE — Progress Notes (Signed)
   Patient information  Patient Name: Brittany Roman  Patient MRN:   [id number]  Referring practice: MFM Referring Provider: [facility])  MFM CONSULT  [facility] Brittany Roman is a 23 y.o. G2P0010 at [redacted]w[redacted]d here for ultrasound and consultation. 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  Choroid plexus cyst was seen on today's ultrasound.  The first aneuploidy screening did not yield a result due to specimen error.  Her repeat drawl was low risk but was drawn through the [employer] directly and therefore is not in our system but it has been verified by our Dentist.  While this finding is traditionally associated with trisomy 20, in the setting of low risk aneuploidy screening a cord plexus cyst is considered a normal variant.   Sonographic findings Single intrauterine pregnancy at 19w 0d. Fetal cardiac activity:  Observed and appears normal. Presentation: Cephalic. The anatomic structures that were well seen appear normal. The anatomic survey is complete.  Fetal biometry shows the estimated fetal weight at the 30 percentile. Amniotic fluid: Within normal limits.  MVP: 5.36 cm. Placenta: Posterior. Adnexa: Within normal limits.  Cervical length: 3.2 cm.  There are limitations of prenatal ultrasound such as the inability to detect certain abnormalities due to poor visualization. Various factors such as fetal position, gestational age and maternal body habitus may increase the difficulty in visualizing the fetal anatomy.    Recommendations -EDD should be 08/03/2024 based on CRL1st  (12/14/23). -No further ultrasounds are recommended at this time based on the current indications. If future indications arise (e.g. size/date discrepancy on fundal  height, gestational diabetes or hypertension) and an ultrasound is to be desired at our MFM office, please send a referral.   Review of Systems: A review of systems was performed and was negative except per HPI   Vitals and Physical Exam    03/09/2024    8:09 AM 02/19/2024   11:16 AM 01/20/2024   10:08 AM  Vitals with BMI  Weight  105 lbs 3 oz 102 lbs 8 oz  Systolic 105 106 409  Diastolic 62 64 64  Pulse 80 105 89    Sitting comfortably on the sonogram table Nonlabored breathing Normal rate and rhythm Abdomen is nontender  Past pregnancies OB History  Gravida Para Term Preterm AB Living  2    1   SAB IAB Ectopic Multiple Live Births  1        # Outcome Date GA Lbr Len/2nd Weight Sex Type Anes PTL Lv  2 Current           1 SAB 2025             I spent 30 minutes reviewing the patients chart, including labs and images as well as counseling the patient about her medical conditions. Greater than 50% of the time was spent in direct face-to-face patient counseling.  Penney Bowling  MFM, [facility]   03/09/2024  3:19 PM

## 2024-03-16 ENCOUNTER — Encounter (HOSPITAL_COMMUNITY): Payer: Self-pay | Admitting: Obstetrics and Gynecology

## 2024-03-16 ENCOUNTER — Telehealth: Payer: Self-pay

## 2024-03-16 ENCOUNTER — Inpatient Hospital Stay (HOSPITAL_COMMUNITY)
Admission: AD | Admit: 2024-03-16 | Discharge: 2024-03-16 | Disposition: A | Discharge status: Home / Self Care | Attending: Obstetrics and Gynecology | Admitting: Obstetrics and Gynecology

## 2024-03-16 DIAGNOSIS — O368120 Decreased fetal movements, second trimester, not applicable or unspecified: Secondary | ICD-10-CM | POA: Diagnosis not present

## 2024-03-16 DIAGNOSIS — O208 Other hemorrhage in early pregnancy: Secondary | ICD-10-CM | POA: Diagnosis not present

## 2024-03-16 DIAGNOSIS — R109 Unspecified abdominal pain: Secondary | ICD-10-CM | POA: Diagnosis not present

## 2024-03-16 DIAGNOSIS — O26892 Other specified pregnancy related conditions, second trimester: Secondary | ICD-10-CM | POA: Diagnosis present

## 2024-03-16 DIAGNOSIS — Z3A20 20 weeks gestation of pregnancy: Secondary | ICD-10-CM

## 2024-03-16 DIAGNOSIS — O468X2 Other antepartum hemorrhage, second trimester: Secondary | ICD-10-CM

## 2024-03-16 LAB — URINALYSIS, ROUTINE W REFLEX MICROSCOPIC
Bilirubin Urine: NEGATIVE
Glucose, UA: NEGATIVE mg/dL
Hgb urine dipstick: NEGATIVE
Ketones, ur: NEGATIVE mg/dL
Leukocytes,Ua: NEGATIVE
Nitrite: NEGATIVE
Protein, ur: NEGATIVE mg/dL
Specific Gravity, Urine: 1.002 — ABNORMAL LOW (ref 1.005–1.030)
pH: 8 (ref 5.0–8.0)

## 2024-03-16 NOTE — Telephone Encounter (Signed)
 Received call from Amy from Access Nurse, she states that she triaged Pt today and she was feeling baby moved but has not recently, advised for Pt to go to MAU.

## 2024-03-16 NOTE — MAU Provider Note (Signed)
 Cramping and DFM  S [name] is a 22 y.o. G2P0010 pregnant female at [redacted]w[redacted]d who presents to MAU today with complaint of cramping and DFM. Pt states cramping started a few days ago.  She states usually she feels active movement after meals and when she lays down previously and though she understands patterns are not normally established this early was concerned when she hasn't felt much of any of the prior movements.  Denies VB, GI or GU complaints. Pt states had coitus around the time that cramping started and had coitus last night when she noticed an uptick in symptoms.   Receives care at [facility]. Prenatal records reviewed.  Pertinent items noted in HPI and remainder of comprehensive ROS otherwise negative.   O BP (!) 101/55 (BP Location: Right Arm)   Pulse 95   Temp 98.7 F (37.1 C) (Oral)   Resp 17   Ht 5' 3 (1.6 m)   Wt 49 kg   LMP 09/12/2023 (Exact Date)   SpO2 100%   BMI 19.13 kg/m  Physical Exam Vitals and nursing note reviewed.  Constitutional:      General: She is not in acute distress.    Appearance: She is well-developed and normal weight. She is not ill-appearing.  HENT:     Head: Normocephalic and atraumatic.     Mouth/Throat:     Mouth: Mucous membranes are moist.     Pharynx: Oropharynx is clear.  Eyes:     Extraocular Movements: Extraocular movements intact.  Cardiovascular:     Rate and Rhythm: Normal rate.  Pulmonary:     Effort: Pulmonary effort is normal. No respiratory distress.  Abdominal:     General: There is no distension.     Palpations: Abdomen is soft.     Tenderness: There is no abdominal tenderness.     Comments: gravid  Skin:    General: Skin is warm and dry.  Neurological:     Mental Status: She is alert and oriented to person, place, and time.     Motor: No weakness.  Psychiatric:        Mood and Affect: Mood normal.        Behavior: Behavior normal.     Pt informed that the ultrasound is considered a limited OB ultrasound and  is not intended to be a complete ultrasound exam.  Patient also informed that the ultrasound is not being completed with the intent of assessing for fetal or placental anomalies or any pelvic abnormalities.  Explained that the purpose of today's ultrasound is to assess for  viability.  Patient acknowledges the purpose of the exam and the limitations of the study.    My interpretation: SIUP with fetal cardiac activity of 154bpm, abundant fetal movement witnessed by provider and patient, small [facility] noted   MDM: MAU Course:  Pt states recent coitus and small [facility] hemorrhage seen on BSUS today.  However, reassuring FHT and movement noted.  Pt declines PRN pain medications at this time.  ROB 6/13. Stable for d/c.   AP #[redacted] weeks gestation #Subchorionic hemorrhage - pelvic rest  - f/u OP at [facility] on 6/13 as previously scheduled  Discharge from MAU in stable condition with strict/usual precautions Follow up at [facility] as scheduled for ongoing prenatal care  Allergies as of 03/16/2024   No Known Allergies      Medication List     TAKE these medications    Blood Pressure Kit Devi 1 Device by Does not  apply route as needed.   Gojji Weight Scale Misc 1 Device by Does not apply route as needed.   PRENATAL VITAMIN PO Take 1 tablet by mouth daily at 6 (six) AM.        Ebony Goldstein, MD 03/16/2024 2:54 PM

## 2024-03-16 NOTE — MAU Note (Signed)
 Brittany Roman is a 23 y.o. at [redacted]w[redacted]d here in MAU reporting: has noted some cramping last few days. Has also noted a decrease in fm, I know there aren't regular patterns this early, but she is usually really active after she eats and when she lays down,  hasn't been feeling as much of that.  No bleeding.  No GI or urinary complaints.   Onset of complaint: 3 days Pain score: mild Vitals:   03/16/24 1425  BP: (!) 101/55  Pulse: 95  Resp: 17  Temp: 98.7 F (37.1 C)  SpO2: 100%     FHT:160 Lab orders placed from triage:  UA colllected

## 2024-03-18 ENCOUNTER — Other Ambulatory Visit: Payer: Self-pay

## 2024-03-18 ENCOUNTER — Ambulatory Visit: Admitting: Family Medicine

## 2024-03-18 ENCOUNTER — Encounter: Payer: Self-pay | Admitting: Family Medicine

## 2024-03-18 VITALS — BP 106/66 | HR 102 | Wt 110.1 lb

## 2024-03-18 DIAGNOSIS — Z3480 Encounter for supervision of other normal pregnancy, unspecified trimester: Secondary | ICD-10-CM

## 2024-03-18 DIAGNOSIS — O09892 Supervision of other high risk pregnancies, second trimester: Secondary | ICD-10-CM | POA: Diagnosis not present

## 2024-03-18 DIAGNOSIS — Z3A20 20 weeks gestation of pregnancy: Secondary | ICD-10-CM | POA: Diagnosis not present

## 2024-03-18 DIAGNOSIS — O09899 Supervision of other high risk pregnancies, unspecified trimester: Secondary | ICD-10-CM

## 2024-03-18 DIAGNOSIS — O468X2 Other antepartum hemorrhage, second trimester: Secondary | ICD-10-CM | POA: Diagnosis not present

## 2024-03-18 DIAGNOSIS — Z2839 Other underimmunization status: Secondary | ICD-10-CM | POA: Diagnosis not present

## 2024-03-18 NOTE — Patient Instructions (Addendum)
 If you are less than 36 weeks, please go to the hospital of you have:  1. Contractions: Contractions feels like menstrual cramps. You should go to the hospital if you have more than 6 contractions in an hour, even after you have rested and drank at least 16 ounces of water.  2. Vaginal Bleeding: You have heavy bleeding requiring the use of a pad.  3. Leaking Fluid: sometimes it is obvious when your water breaks causing a huge gush of fluid. However, many times it may it may be much more subtle. You should go to the hospital if you have a constant leakage of fluid from your vagina, enough to soak a pad when you are walking around.  4. Decreased Fetal Movement: if you think that you baby's movement is decreased, eat a snack and rest on your left side in a quiet room for one hour. If you have not felt the baby move more than 6 times in an hour GO TO THE HOSPITAL.   As long as your prenatal vitamin has the necessary components (especially folic acid, iron, and calcium), you do not need a particular brand or formulation! Tablets, capsules, gummies, or liquid options are great depending on what works best for you.

## 2024-03-18 NOTE — Progress Notes (Signed)
   PRENATAL VISIT NOTE  Subjective:  Brittany Roman is a 23 y.o. G2P0010 at [redacted]w[redacted]d being seen today for ongoing prenatal care.  She is currently monitored for the following issues for this low-risk pregnancy and has Supervision of other normal pregnancy, antepartum; Rubella non-immune status, antepartum; and Choroid plexus cyst on their problem list.  Patient reports no complaints.   . Vag. Bleeding: None.  Movement: Present. Denies leaking of fluid.   The following portions of the patient's history were reviewed and updated as appropriate: allergies, current medications, past family history, past medical history, past social history, past surgical history and problem list.   Objective:    Vitals:   03/18/24 1110  BP: 106/66  Pulse: (!) 102  Weight: 110 lb 1.6 oz (49.9 kg)    Fetal Status:  Fetal Heart Rate (bpm): 153 Fundal Height: 21 cm Movement: Present    General: Alert, oriented and cooperative. Patient is in no acute distress.  Skin: Skin is warm and dry. No rash noted.   Cardiovascular: Normal heart rate noted  Respiratory: Normal respiratory effort, no problems with respiration noted  Abdomen: Soft, gravid, appropriate for gestational age.  Pain/Pressure: Present     Pelvic: Cervical exam deferred        Extremities: Normal range of motion.     Mental Status: Normal mood and affect. Normal behavior. Normal judgment and thought content.   Assessment and Plan:  Pregnancy: G2P0010 at [redacted]w[redacted]d 1. Supervision of other normal pregnancy, antepartum (Primary) BP and FHR normal Doing well, feeling regular movement   Discussed that particular brands of prenatal vitamins are not necessary as long as they have necessary components of folic acid and iron.  2. Rubella non-immune status, antepartum Treat after delivery  3. Subchorionic hemorrhage of placenta in second trimester Provided information and education regarding subchorionic hemorrhage. Reviewed precautions for return to  MAU  4. [redacted] weeks gestation of pregnancy Fundal height appropriate for gestational age  Preterm labor symptoms and general obstetric precautions including but not limited to vaginal bleeding, contractions, leaking of fluid and fetal movement were reviewed in detail with the patient. Please refer to After Visit Summary for other counseling recommendations.   Return in about 4 weeks (around 04/15/2024) for LOB.  No future appointments.  Noreene Bearded, PA

## 2024-03-18 NOTE — Progress Notes (Signed)
 Notes from MAU visit on 03/16/24:  My interpretation: SIUP with fetal cardiac activity of 154bpm, abundant fetal movement witnessed by provider and patient, small [facility] noted

## 2024-04-20 ENCOUNTER — Ambulatory Visit (INDEPENDENT_AMBULATORY_CARE_PROVIDER_SITE_OTHER): Admitting: Certified Nurse Midwife

## 2024-04-20 ENCOUNTER — Other Ambulatory Visit: Payer: Self-pay

## 2024-04-20 VITALS — BP 105/68 | HR 101 | Wt 114.0 lb

## 2024-04-20 DIAGNOSIS — Z3492 Encounter for supervision of normal pregnancy, unspecified, second trimester: Secondary | ICD-10-CM

## 2024-04-20 DIAGNOSIS — Z3A25 25 weeks gestation of pregnancy: Secondary | ICD-10-CM | POA: Diagnosis not present

## 2024-04-20 NOTE — Progress Notes (Signed)
   PRENATAL VISIT NOTE  Subjective:  Brittany Roman is a 23 y.o. G2P0010 at [redacted]w[redacted]d being seen today for ongoing prenatal care.  She is currently monitored for the following issues for this low-risk pregnancy and has Supervision of other normal pregnancy, antepartum; Rubella non-immune status, antepartum; and Choroid plexus cyst on their problem list.  Patient reports no complaints.  Contractions: Not present. Vag. Bleeding: None.  Movement: Present. Denies leaking of fluid.   The following portions of the patient's history were reviewed and updated as appropriate: allergies, current medications, past family history, past medical history, past social history, past surgical history and problem list.   Objective:    Vitals:   04/20/24 1600  BP: 105/68  Pulse: (!) 101  Weight: 114 lb (51.7 kg)    Fetal Status:  Fetal Heart Rate (bpm): 160   Movement: Present    General: Alert, oriented and cooperative. Patient is in no acute distress.  Skin: Skin is warm and dry. No rash noted.   Cardiovascular: Normal heart rate noted  Respiratory: Normal respiratory effort, no problems with respiration noted  Abdomen: Soft, gravid, appropriate for gestational age.  Pain/Pressure: Absent     Pelvic: Cervical exam deferred        Extremities: Normal range of motion.  Edema: None  Mental Status: Normal mood and affect. Normal behavior. Normal judgment and thought content.   Assessment and Plan:  Pregnancy: G2P0010 at [redacted]w[redacted]d 1. Encounter for supervision of low-risk pregnancy in second trimester (Primary) - Doing well, feeling regular and vigorous fetal movement   2. [redacted] weeks gestation of pregnancy - Routine OB care   Preterm labor symptoms and general obstetric precautions including but not limited to vaginal bleeding, contractions, leaking of fluid and fetal movement were reviewed in detail with the patient. Please refer to After Visit Summary for other counseling recommendations.   Return in about 3  weeks (around 05/11/2024) for LOB/GTT.  Future Appointments  Date Time Provider Department Center  05/11/2024  9:40 AM [facility]  05/11/2024 10:35 AM Vannie Cornell SAUNDERS, CNM [facility]    Cornell SAUNDERS Vannie, CNM

## 2024-05-03 ENCOUNTER — Other Ambulatory Visit: Payer: Self-pay

## 2024-05-03 DIAGNOSIS — Z3A26 26 weeks gestation of pregnancy: Secondary | ICD-10-CM

## 2024-05-11 ENCOUNTER — Other Ambulatory Visit: Payer: Self-pay

## 2024-05-11 ENCOUNTER — Other Ambulatory Visit

## 2024-05-11 ENCOUNTER — Ambulatory Visit: Payer: Self-pay | Admitting: Certified Nurse Midwife

## 2024-05-11 VITALS — BP 105/68 | HR 93 | Wt 115.8 lb

## 2024-05-11 DIAGNOSIS — Z3A26 26 weeks gestation of pregnancy: Secondary | ICD-10-CM

## 2024-05-11 DIAGNOSIS — Z1332 Encounter for screening for maternal depression: Secondary | ICD-10-CM | POA: Diagnosis not present

## 2024-05-11 DIAGNOSIS — Z3A28 28 weeks gestation of pregnancy: Secondary | ICD-10-CM

## 2024-05-11 DIAGNOSIS — Z3480 Encounter for supervision of other normal pregnancy, unspecified trimester: Secondary | ICD-10-CM

## 2024-05-11 DIAGNOSIS — Z3483 Encounter for supervision of other normal pregnancy, third trimester: Secondary | ICD-10-CM

## 2024-05-12 LAB — CBC
Hematocrit: 31 % — ABNORMAL LOW (ref 34.0–46.6)
Hemoglobin: 10.4 g/dL — ABNORMAL LOW (ref 11.1–15.9)
MCH: 31.6 pg (ref 26.6–33.0)
MCHC: 33.5 g/dL (ref 31.5–35.7)
MCV: 94 fL (ref 79–97)
Platelets: 199 x10E3/uL (ref 150–450)
RBC: 3.29 x10E6/uL — ABNORMAL LOW (ref 3.77–5.28)
RDW: 11.9 % (ref 11.7–15.4)
WBC: 8.8 x10E3/uL (ref 3.4–10.8)

## 2024-05-12 LAB — GLUCOSE TOLERANCE, 2 HOURS W/ 1HR
Glucose, 1 hour: 108 mg/dL (ref 70–179)
Glucose, 2 hour: 102 mg/dL (ref 70–152)
Glucose, Fasting: 71 mg/dL (ref 70–91)

## 2024-05-12 LAB — HIV ANTIBODY (ROUTINE TESTING W REFLEX): HIV Screen 4th Generation wRfx: NONREACTIVE

## 2024-05-12 LAB — RPR: RPR Ser Ql: NONREACTIVE

## 2024-05-16 ENCOUNTER — Encounter (HOSPITAL_COMMUNITY): Payer: Self-pay | Admitting: Obstetrics & Gynecology

## 2024-05-16 ENCOUNTER — Inpatient Hospital Stay (HOSPITAL_COMMUNITY)
Admission: AD | Admit: 2024-05-16 | Discharge: 2024-05-16 | Disposition: A | Discharge status: Home / Self Care | Attending: Obstetrics & Gynecology | Admitting: Obstetrics & Gynecology

## 2024-05-16 DIAGNOSIS — K5900 Constipation, unspecified: Secondary | ICD-10-CM | POA: Diagnosis present

## 2024-05-16 DIAGNOSIS — O26893 Other specified pregnancy related conditions, third trimester: Secondary | ICD-10-CM | POA: Diagnosis not present

## 2024-05-16 DIAGNOSIS — O99613 Diseases of the digestive system complicating pregnancy, third trimester: Secondary | ICD-10-CM | POA: Diagnosis not present

## 2024-05-16 DIAGNOSIS — Z3A28 28 weeks gestation of pregnancy: Secondary | ICD-10-CM

## 2024-05-16 DIAGNOSIS — Z113 Encounter for screening for infections with a predominantly sexual mode of transmission: Secondary | ICD-10-CM | POA: Diagnosis present

## 2024-05-16 DIAGNOSIS — F419 Anxiety disorder, unspecified: Secondary | ICD-10-CM | POA: Diagnosis present

## 2024-05-16 DIAGNOSIS — R1032 Left lower quadrant pain: Secondary | ICD-10-CM | POA: Diagnosis not present

## 2024-05-16 DIAGNOSIS — R102 Pelvic and perineal pain: Secondary | ICD-10-CM

## 2024-05-16 DIAGNOSIS — Z3689 Encounter for other specified antenatal screening: Secondary | ICD-10-CM | POA: Diagnosis not present

## 2024-05-16 DIAGNOSIS — K219 Gastro-esophageal reflux disease without esophagitis: Secondary | ICD-10-CM | POA: Diagnosis not present

## 2024-05-16 LAB — URINALYSIS, ROUTINE W REFLEX MICROSCOPIC
Bacteria, UA: NONE SEEN
Bilirubin Urine: NEGATIVE
Glucose, UA: NEGATIVE mg/dL
Hgb urine dipstick: NEGATIVE
Ketones, ur: NEGATIVE mg/dL
Leukocytes,Ua: NEGATIVE
Nitrite: NEGATIVE
Protein, ur: NEGATIVE mg/dL
Specific Gravity, Urine: 1.001 — ABNORMAL LOW (ref 1.005–1.030)
pH: 7 (ref 5.0–8.0)

## 2024-05-16 LAB — WET PREP, GENITAL
Clue Cells Wet Prep HPF POC: NONE SEEN
Sperm: NONE SEEN
Trich, Wet Prep: NONE SEEN
WBC, Wet Prep HPF POC: <10 (ref <10)
Yeast Wet Prep HPF POC: NONE SEEN

## 2024-05-16 MED ORDER — FAMOTIDINE 20 MG PO TABS
20.0000 mg | ORAL_TABLET | Freq: Once | ORAL | Status: AC
  Administered 2024-05-16 (×2): 20 mg via ORAL
  Filled 2024-05-16: qty 1

## 2024-05-16 MED ORDER — FAMOTIDINE 20 MG PO TABS: 20.0000 mg | ORAL_TABLET | Freq: Two times a day (BID) | ORAL | 1 refills | Status: DC

## 2024-05-16 MED ORDER — LIDOCAINE VISCOUS HCL 2 % MT SOLN
15.0000 mL | Freq: Once | OROMUCOSAL | Status: AC
  Administered 2024-05-16 (×2): 15 mL via ORAL
  Filled 2024-05-16: qty 15

## 2024-05-16 MED ORDER — ALUM & MAG HYDROXIDE-SIMETH 200-200-20 MG/5ML PO SUSP
30.0000 mL | Freq: Once | ORAL | Status: AC
  Administered 2024-05-16 (×2): 30 mL via ORAL
  Filled 2024-05-16: qty 30

## 2024-05-16 MED ORDER — ACETAMINOPHEN 500 MG PO TABS
1000.0000 mg | ORAL_TABLET | Freq: Once | ORAL | Status: AC
  Administered 2024-05-16 (×2): 1000 mg via ORAL
  Filled 2024-05-16: qty 2

## 2024-05-16 MED ORDER — LACTATED RINGERS IV BOLUS: 1000.0000 mL | Freq: Once | INTRAVENOUS | Status: DC

## 2024-05-16 MED ORDER — FAMOTIDINE IN NACL 20-0.9 MG/50ML-% IV SOLN: 20.0000 mg | Freq: Once | INTRAVENOUS | Status: DC

## 2024-05-16 NOTE — MAU Note (Signed)
 MAU Triage Note:  .[name] is a 23 y.o. at [redacted]w[redacted]d here in MAU reporting: for the past several hours has been feeling nauseous, lower abdominal cramping and bloating, also felt tightness in her chest with breathing earlier (she believes this was related to anxiety. The pain feels like constipation discomfort. Denies VB or LOF. Reports +FM.   Patient complaint: cramping, nausea, tightness in chest and stomach, really bad acid reflex, back pain  Pain Score: 6  Pain Location: Abdomen     Onset of complaint: today LMP: Patient's last menstrual period was 09/12/2023 (exact date).  Vitals:   05/16/24 2141  BP: (!) 108/54  Pulse: 88  Resp: 18  Temp: 98 F (36.7 C)  SpO2: 100%    FHT:  Fetal Heart Rate Mode: Doppler Baseline Rate (A): 159 bpm Lab orders placed from triage: UA

## 2024-05-16 NOTE — MAU Provider Note (Signed)
 MAU Provider Note  Chief Complaint: Abdominal Pain and Nausea   Event Date/Time   First Provider Initiated Contact with Patient 05/16/24 2233      SUBJECTIVE HPI: Brittany Roman is a 23 y.o. G2P0010 at [redacted]w[redacted]d by early ultrasound who presents to maternity admissions reporting nausea, lower abdominal pain, reflux. Pregnancy c/b [facility] first trimester. Receives [facility] with [facility].  Patient started to have lower abdominal cramping (mostly left side) around 5 PM. Comes and goes. Worse with movement. Felt nauseated and acid reflux as well. Began to feel anxious and couldn't eat dinner. Still feeling burning sensation in her throat/chest. Does have some constipation. Denies VB, LOF, contractions, change in discharge, urinary symptoms.  HPI  Past Medical History:  Diagnosis Date   Anxiety    Past Surgical History:  Procedure Laterality Date   TONSILLECTOMY     Social History   Socioeconomic History   Marital status: Single    Spouse name: Not on file   Number of children: Not on file   Years of education: Not on file   Highest education level: Some college, no degree  Occupational History   Not on file  Tobacco Use   Smoking status: Never   Smokeless tobacco: Not on file  Vaping Use   Vaping status: Former   Substances: Nicotine  Substance and Sexual Activity   Alcohol use: Yes    Comment: Occ   Drug use: Not Currently   Sexual activity: Yes    Birth control/protection: None  Other Topics Concern   Not on file  Social History Narrative   ** Merged History Encounter **       Social Drivers of Health   Financial Resource Strain: Medium Risk (12/07/2023)   Overall Financial Resource Strain (CARDIA)    Difficulty of Paying Living Expenses: Somewhat hard  Food Insecurity: No Food Insecurity (01/13/2024)   Hunger Vital Sign    Worried About Running Out of Food in the Last Year: Never true    Ran Out of Food in the Last Year: Never true  Transportation Needs: No Transportation Needs  (01/13/2024)   PRAPARE - Administrator, Civil Service (Medical): No    Lack of Transportation (Non-Medical): No  Physical Activity: Sufficiently Active (12/07/2023)   Exercise Vital Sign    Days of Exercise per Week: 5 days    Minutes of Exercise per Session: 60 min  Stress: No Stress Concern Present (12/07/2023)   [employer] of Occupational Health - Occupational Stress Questionnaire    Feeling of Stress : Only a little  Social Connections: Moderately Integrated (12/07/2023)   Social Connection and Isolation Panel    Frequency of Communication with Friends and Family: More than three times a week    Frequency of Social Gatherings with Friends and Family: More than three times a week    Attends Religious Services: More than 4 times per year    Active Member of [employer] or Organizations: No    Attends Banker Meetings: Not on file    Marital Status: Living with partner  Intimate Partner Violence: Unknown (01/10/2022)   Received from [facility]   HITS    Physically Hurt: Not on file    Insult or Talk Down To: Not on file    Threaten Physical Harm: Not on file    Scream or Curse: Not on file   No current facility-administered medications on file prior to encounter.   Current Outpatient Medications on File Prior  to Encounter  Medication Sig Dispense Refill   Prenatal Vit-Fe Fumarate-FA (PRENATAL VITAMIN PO) Take 1 tablet by mouth daily at 6 (six) AM.     Blood Pressure Monitoring (BLOOD PRESSURE KIT) DEVI 1 Device by Does not apply route as needed. 1 each 0   Misc. Devices (GOJJI WEIGHT SCALE) MISC 1 Device by Does not apply route as needed. 1 each 0   No Known Allergies  ROS:  Pertinent positives/negatives listed above.  I have reviewed patient's Past Medical Hx, Surgical Hx, Family Hx, Social Hx, medications and allergies.   Physical Exam  Patient Vitals for the past 24 hrs:  BP Temp Temp src Pulse Resp SpO2 Height Weight  05/16/24 2330 109/70 -- -- 91  -- -- -- --  05/16/24 2230 -- -- -- -- -- 100 % -- --  05/16/24 2200 112/63 -- -- 79 -- 100 % -- --  05/16/24 2141 (!) 108/54 98 F (36.7 C) Oral 88 18 100 % 5' 3 (1.6 m) 53.4 kg   Constitutional: Well-developed, well-nourished female in no acute distress. Mildly anxious  Cardiovascular: normal rate Respiratory: normal effort GI: Abd soft, non-tender. Points to left groin/very LLQ as location of pain. None at time of my exam. Gravid MS: Extremities nontender, no edema, normal ROM Neurologic: Alert and oriented x 4  GU: Neg CVAT  FHT:  Baseline 150, moderate variability, 10x10 accelerations present, no decelerations Contractions: quiet  LAB RESULTS Results for orders placed or performed during the hospital encounter of 05/16/24 (from the past 24 hours)  Urinalysis, Routine w reflex microscopic -Urine, Clean Catch     Status: Abnormal   Collection Time: 05/16/24  9:32 PM  Result Value Ref Range   Color, Urine COLORLESS (A) YELLOW   APPearance CLEAR CLEAR   Specific Gravity, Urine 1.001 (L) 1.005 - 1.030   pH 7.0 5.0 - 8.0   Glucose, UA NEGATIVE NEGATIVE mg/dL   Hgb urine dipstick NEGATIVE NEGATIVE   Bilirubin Urine NEGATIVE NEGATIVE   Ketones, ur NEGATIVE NEGATIVE mg/dL   Protein, ur NEGATIVE NEGATIVE mg/dL   Nitrite NEGATIVE NEGATIVE   Leukocytes,Ua NEGATIVE NEGATIVE   RBC / HPF 0-5 0 - 5 RBC/hpf   WBC, UA 0-5 0 - 5 WBC/hpf   Bacteria, UA NONE SEEN NONE SEEN   Squamous Epithelial / HPF 0-5 0 - 5 /HPF  Wet prep, genital     Status: None   Collection Time: 05/16/24 10:48 PM  Result Value Ref Range   Yeast Wet Prep HPF POC NONE SEEN NONE SEEN   Trich, Wet Prep NONE SEEN NONE SEEN   Clue Cells Wet Prep HPF POC NONE SEEN NONE SEEN   WBC, Wet Prep HPF POC <10 <10   Sperm NONE SEEN     A/Positive/-- (04/09 1100)  IMAGING No results found.  MAU Management/MDM: Orders Placed This Encounter  Procedures   Wet prep, genital   Urinalysis, Routine w reflex microscopic  -Urine, Clean Catch   Discharge patient    Meds ordered this encounter  Medications   DISCONTD: lactated ringers  bolus 1,000 mL   DISCONTD: famotidine  (PEPCID ) IVPB 20 mg premix   famotidine  (PEPCID ) tablet 20 mg   AND Linked Order Group    alum & mag hydroxide-simeth (MAALOX/MYLANTA) 200-200-20 MG/5ML suspension 30 mL    lidocaine  (XYLOCAINE ) 2 % viscous mouth solution 15 mL   acetaminophen  (TYLENOL ) tablet 1,000 mg   famotidine  (PEPCID ) 20 MG tablet    Sig: Take 1 tablet (20 mg  total) by mouth 2 (two) times daily.    Dispense:  180 tablet    Refill:  1     Available prenatal records reviewed.  Patient presents with LLQ cramping, acid reflux, nausea starting this evening at [redacted]w[redacted]d gestation. #LLQ pain: UA, wet prep obtained. (Declined GC/C.) Both negative. Suspect pain mix of round ligament/pelvic from being on her feet and possible constipation. Felt improved with tylenol. Advised conservative measures. #Acid reflux: This appears to be causing her nausea/burning sensation. Pepcid very helpful. Declined GI cocktail. Prescribed pepcid for home for reflux prevention. #FWB: Reactive NST for gestation. +FM. No contractions to suggest PTL.  ASSESSMENT 1. Gastroesophageal reflux disease without esophagitis   2. Pelvic pain affecting pregnancy in third trimester, antepartum   3. NST (non-stress test) reactive   4. [redacted] weeks gestation of pregnancy     PLAN Discharge home with strict return precautions. Allergies as of 05/16/2024   No Known Allergies      Medication List     TAKE these medications    Blood Pressure Kit Devi 1 Device by Does not apply route as needed.   famotidine 20 MG tablet Commonly known as: PEPCID Take 1 tablet (20 mg total) by mouth 2 (two) times daily.   Gojji Weight Scale Misc 1 Device by Does not apply route as needed.   PRENATAL VITAMIN PO Take 1 tablet by mouth daily at 6 (six) AM.         Almarie Moats, MD OB Fellow 05/17/2024  6:12  AM

## 2024-05-17 NOTE — Progress Notes (Signed)
   PRENATAL VISIT NOTE  Subjective:  Brittany Roman is a 23 y.o. G2P0010 at [redacted]w[redacted]d being seen today for ongoing prenatal care.  She is currently monitored for the following issues for this low-risk pregnancy and has Supervision of other normal pregnancy, antepartum; Rubella non-immune status, antepartum; and Choroid plexus cyst on their problem list.  Patient reports no complaints.  Contractions: Not present. Vag. Bleeding: None.  Movement: Present. Denies leaking of fluid.   The following portions of the patient's history were reviewed and updated as appropriate: allergies, current medications, past family history, past medical history, past social history, past surgical history and problem list.   Objective:    Vitals:   05/11/24 1102  BP: 105/68  Pulse: 93  Weight: 115 lb 12.8 oz (52.5 kg)    Fetal Status:  Fetal Heart Rate (bpm): 155 Fundal Height: 26 cm Movement: Present    General: Alert, oriented and cooperative. Patient is in no acute distress.  Skin: Skin is warm and dry. No rash noted.   Cardiovascular: Normal heart rate noted  Respiratory: Normal respiratory effort, no problems with respiration noted  Abdomen: Soft, gravid, appropriate for gestational age.  Pain/Pressure: Absent     Pelvic: Cervical exam deferred        Extremities: Normal range of motion.  Edema: None  Mental Status: Normal mood and affect. Normal behavior. Normal judgment and thought content.   Assessment and Plan:  Pregnancy: G2P0010 at [redacted]w[redacted]d 1. Supervision of other normal pregnancy, antepartum (Primary) - Doing well, feeling regular and vigorous fetal movement   2. [redacted] weeks gestation of pregnancy - Routine OB care including GTT today  Preterm labor symptoms and general obstetric precautions including but not limited to vaginal bleeding, contractions, leaking of fluid and fetal movement were reviewed in detail with the patient. Please refer to After Visit Summary for other counseling  recommendations.   Return in about 2 weeks (around 05/25/2024) for IN-PERSON, LOB.  Future Appointments  Date Time Provider Department Center  05/25/2024  4:15 PM Vannie Cornell JONELLE EDDY [facility]    Cornell JONELLE Vannie, [place]

## 2024-05-19 ENCOUNTER — Encounter: Payer: Self-pay | Admitting: Certified Nurse Midwife

## 2024-05-19 ENCOUNTER — Ambulatory Visit: Payer: Self-pay | Admitting: Certified Nurse Midwife

## 2024-05-19 DIAGNOSIS — D509 Iron deficiency anemia, unspecified: Secondary | ICD-10-CM | POA: Insufficient documentation

## 2024-05-25 ENCOUNTER — Other Ambulatory Visit: Payer: Self-pay

## 2024-05-25 ENCOUNTER — Ambulatory Visit: Admitting: Certified Nurse Midwife

## 2024-05-25 VITALS — BP 110/70 | HR 97 | Wt 120.0 lb

## 2024-05-25 DIAGNOSIS — Z3A30 30 weeks gestation of pregnancy: Secondary | ICD-10-CM | POA: Diagnosis not present

## 2024-05-25 DIAGNOSIS — Z23 Encounter for immunization: Secondary | ICD-10-CM

## 2024-05-25 DIAGNOSIS — D509 Iron deficiency anemia, unspecified: Secondary | ICD-10-CM | POA: Diagnosis not present

## 2024-05-25 DIAGNOSIS — O99013 Anemia complicating pregnancy, third trimester: Secondary | ICD-10-CM | POA: Diagnosis not present

## 2024-05-25 DIAGNOSIS — Z3493 Encounter for supervision of normal pregnancy, unspecified, third trimester: Secondary | ICD-10-CM

## 2024-05-25 NOTE — Progress Notes (Signed)
   PRENATAL VISIT NOTE  Subjective:  Brittany Roman is a 23 y.o. G2P0010 at [redacted]w[redacted]d being seen today for ongoing prenatal care.  She is currently monitored for the following issues for this low-risk pregnancy and has Supervision of low-risk pregnancy; Rubella non-immune status, antepartum; Choroid plexus cyst; and Iron deficiency anemia of pregnancy on their problem list.  Patient reports no complaints.  Contractions: Not present. Vag. Bleeding: None.  Movement: Present. Denies leaking of fluid.   The following portions of the patient's history were reviewed and updated as appropriate: allergies, current medications, past family history, past medical history, past social history, past surgical history and problem list.   Objective:    Vitals:   05/25/24 1611  BP: 110/70  Pulse: 97  Weight: 120 lb (54.4 kg)    Fetal Status:  Fetal Heart Rate (bpm): 158   Movement: Present    General: Alert, oriented and cooperative. Patient is in no acute distress.  Skin: Skin is warm and dry. No rash noted.   Cardiovascular: Normal heart rate noted  Respiratory: Normal respiratory effort, no problems with respiration noted  Abdomen: Soft, gravid, appropriate for gestational age.  Pain/Pressure: Absent     Pelvic: Cervical exam deferred        Extremities: Normal range of motion.  Edema: None  Mental Status: Normal mood and affect. Normal behavior. Normal judgment and thought content.   Assessment and Plan:  Pregnancy: G2P0010 at [redacted]w[redacted]d 1. Encounter for supervision of low-risk pregnancy in third trimester (Primary) - Doing well, feeling regular and vigorous fetal movement   2. [redacted] weeks gestation of pregnancy - Routine OB care  - Pt is interested in waterbirth.  No contraindications at this time per chart review/patient assessment.   - Pt to enroll in class, see CNMs for most visits in the office.  - Discussed waterbirth as option for low-risk pregnancy.  Reviewed conditions that may arise during  pregnancy that will risk pt out of waterbirth including hypertension, diabetes, fetal growth restriction <10%ile, etc.  3. Iron deficiency anemia of pregnancy - Taking daily iron supplement  Preterm labor symptoms and general obstetric precautions including but not limited to vaginal bleeding, contractions, leaking of fluid and fetal movement were reviewed in detail with the patient. Please refer to After Visit Summary for other counseling recommendations.   Return in about 2 weeks (around 06/08/2024) for IN-PERSON, LOB.  Future Appointments  Date Time Provider Department Center  06/09/2024  1:55 PM Aliese, Brannum, FNP [facility]  06/21/2024  4:15 PM Brittany Roman [facility]  07/05/2024 11:15 AM Brittany Roman [facility]  07/12/2024  3:15 PM Brittany Burnard CHRISTELLA, MD [facility]  07/19/2024  3:15 PM Davis, Devon E, PA-C [facility]  07/26/2024  2:35 PM Davis, Devon E, PA-C [facility]  08/03/2024  3:15 PM Cresenzo, Norleen GAILS, MD [facility]   Brittany Roman, CNM

## 2024-06-04 ENCOUNTER — Encounter (HOSPITAL_COMMUNITY): Payer: Self-pay | Admitting: Obstetrics & Gynecology

## 2024-06-04 ENCOUNTER — Other Ambulatory Visit: Payer: Self-pay

## 2024-06-04 ENCOUNTER — Inpatient Hospital Stay (HOSPITAL_COMMUNITY)
Admission: AD | Admit: 2024-06-04 | Discharge: 2024-06-04 | Disposition: A | Discharge status: Home / Self Care | Source: Ambulatory Visit | Attending: Obstetrics & Gynecology | Admitting: Obstetrics & Gynecology

## 2024-06-04 DIAGNOSIS — O99333 Smoking (tobacco) complicating pregnancy, third trimester: Secondary | ICD-10-CM | POA: Insufficient documentation

## 2024-06-04 DIAGNOSIS — Z87891 Personal history of nicotine dependence: Secondary | ICD-10-CM | POA: Insufficient documentation

## 2024-06-04 DIAGNOSIS — Z3A31 31 weeks gestation of pregnancy: Secondary | ICD-10-CM

## 2024-06-04 DIAGNOSIS — Z3689 Encounter for other specified antenatal screening: Secondary | ICD-10-CM

## 2024-06-04 DIAGNOSIS — O368130 Decreased fetal movements, third trimester, not applicable or unspecified: Secondary | ICD-10-CM | POA: Diagnosis present

## 2024-06-04 DIAGNOSIS — Z3493 Encounter for supervision of normal pregnancy, unspecified, third trimester: Secondary | ICD-10-CM

## 2024-06-04 NOTE — MAU Note (Signed)
 Brittany Roman is a 23 y.o. at [redacted]w[redacted]d here in MAU reporting: decreased fetal movements for the past 2-3 days. Reports that movements are less strong and that typically baby is a lot more active than it has been. Reports that she sometimes get really anxious, and wanted to wait a couple of days to see if movement got better. States that she has felt about 3 movements in the last 3-4 hours. Last meal 1600. Denies LOF, VB, and Ctxs.   Onset of complaint: 06/01/24 Pain score: 0 There were no vitals filed for this visit.   FHT: 155  Lab orders placed from triage: urine

## 2024-06-04 NOTE — Discharge Instructions (Signed)
 What is a fetal movement count?  A fetal movement count  is the number of times that you feel your baby move during a certain amount of time. This may also be called a fetal kick count by some people, but fetal movement count is a more accurate term. Movements can be kicks, flutters, swishes, rolls, or jabs. A fetal movement count is recommended for every pregnant woman. You may be asked to start counting fetal movements as early as week 28 of your pregnancy. Pay attention to when your baby is most active. Your baby has times of activity and times to sleep just like you do! You may notice your baby's sleep and wake cycles. You may also notice things that make your baby move more. You should do a fetal movement count: When your baby is normally most active. At the same time each day. A good time to count movements is while you are resting, after having something to eat and drink.  How do I count fetal movements? Find a quiet, comfortable area. Sit, or lie down on your side. Write down the date, the start time and stop time, and the number of movements that you felt between those two times. Take this information with you to your health care visits. Write down your start time when you feel the first movement. Count kicks, flutters, swishes, rolls, and jabs. Any movement counts! You should feel at least 10 movements. You may stop counting after you have felt 10 movements, or if you have been counting for 2 hours. Write down the stop time. If you do not feel 10 movements in 2 hours, contact your health care provider for further instructions. Your health care provider may want to do additional tests to assess your baby's well-being. Contact a health care provider if: You feel fewer than 10 movements in 2 hours. Your baby is not moving like he or she usually does.    Reasons to return to MAU at [facility]: Less than 36 weeks: Contractions feels like menstrual cramps. You  should go to the hospital if you have more than 6 contractions in an hour, even after you have rested and drank at least 16 ounces of water.  More than 36 weeks: You begin to have strong, frequent contractions 5 minutes apart or less, each last 1 minute, these have been going on for 1-2 hours, and you cannot walk or talk during them. Your water breaks.  Sometimes it is a big gush of fluid. However, many times it may it may be much more subtle. You should go to the hospital if you have a constant leakage of fluid from your vagina, enough to soak a pad when you are walking around.  You have vaginal bleeding.  It is normal to have a small amount of spotting if your cervix was checked. If you have bleeding requiring the use of a pad, go to the hospital. You don't feel your baby moving like normal.  If you think that you baby's movement is decreased, eat a snack and rest on your left side in a quiet room for one hour. If you have not felt the baby move more than 6 times in an hour GO TO THE HOSPITAL.

## 2024-06-04 NOTE — MAU Provider Note (Cosign Needed Addendum)
 Chief Complaint:  Decreased Fetal Movement  HPI  Brittany Roman is a 23 y.o. G2P0010 at [redacted]w[redacted]d who presents to maternity admissions reporting decreased fetal movements.  She reports that for the past 2-3 days, fetal movements have not been as strong as she is used to, and baby has not been as active as she is used to.  She has felt about 3 movements in the 3-4 hours prior to presentation in the MAU.  Denies vaginal bleeding, leaking of fluid, contractions.  Pregnancy Course: Receives care at [facility] . Prenatal records reviewed.   Past Medical History:  Diagnosis Date   Anxiety    OB History  Gravida Para Term Preterm AB Living  2    1   SAB IAB Ectopic Multiple Live Births  1        # Outcome Date GA Lbr Len/2nd Weight Sex Type Anes PTL Lv  2 Current           1 SAB 2025           Past Surgical History:  Procedure Laterality Date   TONSILLECTOMY     Family History  Problem Relation Age of Onset   Cancer Maternal Uncle    Cancer Maternal Grandmother    Cancer Maternal Grandfather    Social History   Tobacco Use   Smoking status: Never  Vaping Use   Vaping status: Former   Substances: Nicotine  Substance Use Topics   Alcohol use: Yes    Comment: Occ   Drug use: Not Currently   No Known Allergies No medications prior to admission.   I have reviewed patient's Past Medical Hx, Surgical Hx, Family Hx, Social Hx, medications and allergies.   ROS  Pertinent items noted in HPI and remainder of comprehensive ROS otherwise negative.   PHYSICAL EXAM  Patient Vitals for the past 24 hrs:  BP Temp Temp src Pulse Resp SpO2 Height Weight  06/04/24 2133 108/60 -- -- 87 -- 100 % -- --  06/04/24 2125 -- -- -- -- -- 99 % -- --  06/04/24 1945 110/68 -- -- (!) 108 -- 100 % -- --  06/04/24 1939 113/72 98.1 F (36.7 C) Oral 97 18 100 % 5' 3 (1.6 m) 118 lb (53.5 kg)  06/04/24 1933 -- -- -- -- -- 100 % -- --   Constitutional: Well-developed,  well-nourished female in no acute distress.  HEENT: atraumatic, normocephalic. Neck has normal ROM. EOM intact. Cardiovascular: normal rate & rhythm, warm and well-perfused Respiratory: normal effort, no problems with respiration noted GI: Abd soft, non-tender, non-distended MSK: Extremities nontender, no edema, normal ROM Skin: warm and dry. Acyanotic, no jaundice or pallor. Neurologic: Alert and oriented x 4. No abnormal coordination. Psychiatric: Normal mood. Speech not slurred, not rapid/pressured. Patient is cooperative. GU: no CVA tenderness  Fetal Tracing: Baseline FHR: 145 per minute Fetal heart variability: moderate Fetal Heart Rate accelerations: yes Fetal Heart Rate decelerations: none Fetal Non-stress Test: Category I (reactive) Toco: uterine irritability   Labs: No results found for this or any previous visit (from the past 24 hours).  Imaging:  No results found.  MDM & MAU COURSE  MDM: Moderate  MAU Course: -Vital signs within normal limits. -Category I NST. -Robust fetal movement as perceived by mother after arrival to MAU.  Orders Placed This Encounter  Procedures   Discharge patient   No orders of the defined types were placed in this encounter.  ASSESSMENT  1. Movement of fetus present during pregnancy in third trimester   2. [redacted] weeks gestation of pregnancy   3. NST (non-stress test) reactive    PLAN  Discharge home in stable condition with preterm labor precautions.     Follow-up Information     [facility] Follow up.   Specialty: Obstetrics and Gynecology Why: As scheduled for ongoing prenatal care Contact information: [address] [place]  [address] [phone]                Allergies as of 06/04/2024   No Known Allergies      Medication List     TAKE these medications    Blood Pressure Kit Devi 1 Device by Does not apply route as needed.    famotidine  20 MG tablet Commonly known as: PEPCID  Take 1 tablet (20 mg total) by mouth 2 (two) times daily.   Gojji Weight Scale Misc 1 Device by Does not apply route as needed.   PRENATAL VITAMIN PO Take 1 tablet by mouth daily at 6 (six) AM.       Cornell JONELLE Finder, CNM

## 2024-06-07 ENCOUNTER — Other Ambulatory Visit: Payer: Self-pay

## 2024-06-07 ENCOUNTER — Ambulatory Visit: Admitting: Advanced Practice Midwife

## 2024-06-07 VITALS — BP 99/66 | HR 103 | Wt 122.2 lb

## 2024-06-07 DIAGNOSIS — Z2839 Other underimmunization status: Secondary | ICD-10-CM | POA: Diagnosis not present

## 2024-06-07 DIAGNOSIS — O09893 Supervision of other high risk pregnancies, third trimester: Secondary | ICD-10-CM | POA: Diagnosis not present

## 2024-06-07 DIAGNOSIS — G930 Cerebral cysts: Secondary | ICD-10-CM | POA: Diagnosis not present

## 2024-06-07 DIAGNOSIS — Z3A31 31 weeks gestation of pregnancy: Secondary | ICD-10-CM

## 2024-06-07 DIAGNOSIS — Z3493 Encounter for supervision of normal pregnancy, unspecified, third trimester: Secondary | ICD-10-CM

## 2024-06-07 NOTE — Patient Instructions (Addendum)
 [email or web address]  Fourth Trimester Project [email or web address]

## 2024-06-07 NOTE — Progress Notes (Signed)
   PRENATAL VISIT NOTE  Subjective:  Brittany Roman is a 23 y.o. G2P0010 at [redacted]w[redacted]d being seen today for ongoing prenatal care.  She is currently monitored for the following issues for this low-risk pregnancy and has Supervision of low-risk pregnancy; Rubella non-immune status, antepartum; Choroid plexus cyst; and Iron deficiency anemia of pregnancy on their problem list.   Patient reports no complaints.  Contractions: Not present. Vag. Bleeding: None.  Movement: Present. Denies leaking of fluid.   The following portions of the patient's history were reviewed and updated as appropriate: allergies, current medications, past family history, past medical history, past social history, past surgical history and problem list.   Objective:   Vitals:   06/07/24 1612  BP: 99/66  Pulse: (!) 103  Weight: 122 lb 3.2 oz (55.4 kg)    Fetal Status: Fetal Heart Rate (bpm): 150 Fundal Height: 31 cm Movement: Present  Presentation: Vertex  General:  Alert, oriented and cooperative. Patient is in no acute distress.  Skin: Skin is warm and dry. No rash noted.   Cardiovascular: Normal heart rate noted  Respiratory: Normal respiratory effort, no problems with respiration noted  Abdomen: Soft, gravid, appropriate for gestational age.  Pain/Pressure: Absent     Pelvic: Cervical exam deferred        Extremities: Normal range of motion.  Edema: None  Mental Status: Normal mood and affect. Normal behavior. Normal judgment and thought content.   Assessment and Plan:  Pregnancy: G2P0010 at [redacted]w[redacted]d 1. Encounter for supervision of low-risk pregnancy in third trimester (Primary) - Waterbirth class today.  2. [redacted] weeks gestation of pregnancy  3. Rubella non-immune status, antepartum - MMR PP 4. Choroid plexus cyst - Nml NIPS and US   Preterm labor symptoms and general obstetric precautions including but not limited to vaginal bleeding, contractions, leaking of fluid and fetal movement were reviewed in detail with  the patient. Please refer to After Visit Summary for other counseling recommendations.   No follow-ups on file.  Future Appointments  Date Time Provider Department Center  06/21/2024  1:15 PM Regino Camie DELENA EDDY [facility]  07/06/2024  8:35 AM Vannie Cornell SAUNDERS, CNM [facility]  07/14/2024  3:15 PM Claudene Nick , CNM [facility]  07/22/2024  9:15 AM Emilio Delilah CHRISTELLA, CNM [facility]  07/27/2024  8:35 AM Vannie Cornell SAUNDERS, CNM [facility]  08/02/2024  9:35 AM Regino, Camie DELENA, CNM [facility]    Tymesha Ditmore  Claudene, CNM [facility] for [employer]

## 2024-06-09 ENCOUNTER — Encounter: Admitting: Obstetrics and Gynecology

## 2024-06-18 NOTE — Progress Notes (Signed)
   PRENATAL VISIT NOTE  Subjective:  Brittany Roman is a 23 y.o. G2P0010 at [redacted]w[redacted]d being seen today for ongoing prenatal care.  She is currently monitored for the following issues for this low-risk pregnancy and has Supervision of low-risk pregnancy; Rubella non-immune status, antepartum; Choroid plexus cyst; and Iron deficiency anemia of pregnancy on their problem list.  Patient reports no complaints.  Contractions: Irritability. Vag. Bleeding: None.  Movement: Present. Denies leaking of fluid.   The following portions of the patient's history were reviewed and updated as appropriate: allergies, current medications, past family history, past medical history, past social history, past surgical history and problem list.   Objective:    Vitals:   06/21/24 1329  BP: 112/72  Pulse: 91  Weight: 124 lb 3.2 oz (56.3 kg)    Fetal Status:  Fetal Heart Rate (bpm): 154 Fundal Height: 33 cm Movement: Present    General: Alert, oriented and cooperative. Patient is in no acute distress.  Skin: Skin is warm and dry. No rash noted.   Cardiovascular: Normal heart rate noted  Respiratory: Normal respiratory effort, no problems with respiration noted  Abdomen: Soft, gravid, appropriate for gestational age.  Pain/Pressure: Present     Pelvic: Cervical exam deferred        Extremities: Normal range of motion.     Mental Status: Normal mood and affect. Normal behavior. Normal judgment and thought content.   Assessment and Plan:  Pregnancy: G2P0010 at 100w3d 1. Encounter for supervision of low-risk pregnancy in third trimester (Primary) - Doing well, feeling regular and vigorous fetal movement  2. [redacted] weeks gestation of pregnancy - Routine PNC, anticipatory guidance provided.  - Labor prep advised and provided tips in AVS   Preterm labor symptoms and general obstetric precautions including but not limited to vaginal bleeding, contractions, leaking of fluid and fetal movement were reviewed in detail with  the patient. Please refer to After Visit Summary for other counseling recommendations.   Return in about 2 weeks (around 07/05/2024) for LOB with GBS, with CNM please.  Future Appointments  Date Time Provider Department Center  07/06/2024  8:35 AM Brittany Roman [facility]  07/14/2024  3:15 PM Brittany Roman LABOR, MD [facility]  07/22/2024  9:15 AM Brittany Roman, CNM [facility]  07/27/2024  8:35 AM Brittany Cornell JONELLE, CNM [facility]  08/02/2024  9:35 AM Brittany Roman, Brittany Roman LABOR, CNM [facility]    Brittany Roman LABOR Brittany Roman, CNM

## 2024-06-21 ENCOUNTER — Other Ambulatory Visit: Payer: Self-pay

## 2024-06-21 ENCOUNTER — Ambulatory Visit: Admitting: Certified Nurse Midwife

## 2024-06-21 VITALS — BP 112/72 | HR 91 | Wt 124.2 lb

## 2024-06-21 DIAGNOSIS — Z3A33 33 weeks gestation of pregnancy: Secondary | ICD-10-CM | POA: Diagnosis not present

## 2024-06-21 DIAGNOSIS — Z3493 Encounter for supervision of normal pregnancy, unspecified, third trimester: Secondary | ICD-10-CM

## 2024-06-21 NOTE — Patient Instructions (Signed)
 Magnesium glycinate or magnesium oxide nightly to promote rest and ease bowel movements and leg cramps.    Things to Try After 37 weeks to Encourage Labor/Get Ready for Labor:    Try the [employer] at [email or web address].[email or web address] daily to improve baby's position and encourage the onset of labor.  Walk a little and rest a little every day.  Change positions often.  Cervical Ripening: May try one or both Red Raspberry Leaf capsules or tea:  two 300mg  or 400mg  tablets with each meal, 2-3 times a day, or 1-3 cups of tea daily  Potential Side Effects Of Raspberry Leaf:  Most women do not experience any side effects from drinking raspberry leaf tea. However, nausea and loose stools are possible.  Evening Primrose Oil capsules: take 1 capsule by mouth and place one capsule in the vagina every night.    Some of the potential side effects:  Upset stomach  Loose stools or diarrhea  Headaches  Nausea  Sex can also help the cervix ripen and encourage labor onset.  5. Eating 6-8 dates per day can help soften and even dilate your cervix. You may eat them plain, in smoothies, or in energy balls.  They do contain sugar so eat with caution and balance with protein.

## 2024-07-05 ENCOUNTER — Encounter: Admitting: Certified Nurse Midwife

## 2024-07-06 ENCOUNTER — Ambulatory Visit: Admitting: Certified Nurse Midwife

## 2024-07-06 ENCOUNTER — Other Ambulatory Visit (HOSPITAL_COMMUNITY)
Admission: RE | Admit: 2024-07-06 | Discharge: 2024-07-06 | Disposition: A | Discharge status: Home / Self Care | Source: Ambulatory Visit | Attending: Certified Nurse Midwife | Admitting: Certified Nurse Midwife

## 2024-07-06 ENCOUNTER — Other Ambulatory Visit: Payer: Self-pay

## 2024-07-06 VITALS — BP 120/73 | HR 76 | Wt 125.0 lb

## 2024-07-06 DIAGNOSIS — N949 Unspecified condition associated with female genital organs and menstrual cycle: Secondary | ICD-10-CM | POA: Diagnosis not present

## 2024-07-06 DIAGNOSIS — Z3483 Encounter for supervision of other normal pregnancy, third trimester: Secondary | ICD-10-CM | POA: Insufficient documentation

## 2024-07-06 DIAGNOSIS — Z3493 Encounter for supervision of normal pregnancy, unspecified, third trimester: Secondary | ICD-10-CM | POA: Diagnosis not present

## 2024-07-06 DIAGNOSIS — Z3A36 36 weeks gestation of pregnancy: Secondary | ICD-10-CM

## 2024-07-06 NOTE — Progress Notes (Signed)
   PRENATAL VISIT NOTE  Subjective:  Brittany Roman is a 23 y.o. G2P0010 at [redacted]w[redacted]d being seen today for ongoing prenatal care.  She is currently monitored for the following issues for this low-risk pregnancy and has Supervision of low-risk pregnancy; Rubella non-immune status, antepartum; Choroid plexus cyst; and Iron deficiency anemia of pregnancy on their problem list.  Patient reports Darol Irving and right side round ligament pain.  Contractions: Not present. Vag. Bleeding: None.  Movement: Present. Denies leaking of fluid.   The following portions of the patient's history were reviewed and updated as appropriate: allergies, current medications, past family history, past medical history, past social history, past surgical history and problem list.   Objective:   Vitals:   07/06/24 0849  BP: 120/73  Pulse: 76  Weight: 125 lb (56.7 kg)   Fetal Status:  Fetal Heart Rate (bpm): 140 Fundal Height: 35 cm Movement: Present    General: Alert, oriented and cooperative. Patient is in no acute distress.  Skin: Skin is warm and dry. No rash noted.   Cardiovascular: Normal heart rate noted  Respiratory: Normal respiratory effort, no problems with respiration noted  Abdomen: Soft, gravid, appropriate for gestational age.  Pain/Pressure: Absent     Pelvic: Cervical exam deferred        Extremities: Normal range of motion.  Edema: None  Mental Status: Normal mood and affect. Normal behavior. Normal judgment and thought content.   Assessment and Plan:  Pregnancy: G2P0010 at [redacted]w[redacted]d 1. Encounter for supervision of low-risk pregnancy in third trimester (Primary) - Doing well, feeling regular and vigorous fetal movement   2. [redacted] weeks gestation of pregnancy - Routine OB care  - Culture, beta strep (group b only) - Cervicovaginal ancillary only( [place])  3. Round ligament pain - Encouraged patient to do cat/cow stretches to relieve tension and discomfort   Preterm labor symptoms and general  obstetric precautions including but not limited to vaginal bleeding, contractions, leaking of fluid and fetal movement were reviewed in detail with the patient. Please refer to After Visit Summary for other counseling recommendations.   Future Appointments  Date Time Provider Department Center  07/13/2024  9:35 AM Vannie Cornell JONELLE EDDY [facility]  07/22/2024  9:15 AM Emilio Delilah CHRISTELLA, CNM [facility]  07/27/2024  8:35 AM Vannie Cornell JONELLE, CNM [facility]  08/02/2024  9:35 AM Regino, Camie LABOR, CNM [facility]   Tommy Daring, NP-S

## 2024-07-07 LAB — CERVICOVAGINAL ANCILLARY ONLY
Bacterial Vaginitis (gardnerella): NEGATIVE
Candida Glabrata: NEGATIVE
Candida Vaginitis: POSITIVE — AB
Chlamydia: NEGATIVE
Comment: NEGATIVE
Comment: NEGATIVE
Comment: NEGATIVE
Comment: NEGATIVE
Comment: NEGATIVE
Comment: NORMAL
Neisseria Gonorrhea: NEGATIVE
Trichomonas: NEGATIVE

## 2024-07-10 LAB — CULTURE, BETA STREP (GROUP B ONLY): Strep Gp B Culture: NEGATIVE

## 2024-07-11 ENCOUNTER — Telehealth: Payer: Self-pay | Admitting: Family Medicine

## 2024-07-11 ENCOUNTER — Other Ambulatory Visit: Payer: Self-pay

## 2024-07-11 ENCOUNTER — Telehealth: Payer: Self-pay

## 2024-07-11 DIAGNOSIS — B3731 Acute candidiasis of vulva and vagina: Secondary | ICD-10-CM

## 2024-07-11 LAB — POCT URINALYSIS DIP (DEVICE)
Bilirubin Urine: NEGATIVE
Glucose, UA: NEGATIVE mg/dL
Hgb urine dipstick: NEGATIVE
Ketones, ur: NEGATIVE mg/dL
Leukocytes,Ua: NEGATIVE
Nitrite: NEGATIVE
Protein, ur: NEGATIVE mg/dL
Specific Gravity, Urine: 1.01 (ref 1.005–1.030)
Urobilinogen, UA: 0.2 mg/dL (ref 0.0–1.0)
pH: 7 (ref 5.0–8.0)

## 2024-07-11 MED ORDER — FLUCONAZOLE 150 MG PO TABS: 150.0000 mg | ORAL_TABLET | Freq: Once | ORAL | 0 refills | Status: DC

## 2024-07-11 MED ORDER — TERCONAZOLE 0.4 % VA CREA: TOPICAL_CREAM | VAGINAL | 0 refills | Status: DC

## 2024-07-11 NOTE — Telephone Encounter (Signed)
 Pt called to speak with nurse about Diflucan medication prescribed for yeast infection.  Pt states she does not feel comfortable taking Diflucan, she reports that she researched and read that the CDC and other organizations do not approve of this medication in pregnancy.  Reviewed Diflucan concern with Dr. Fredirick. Advised pt that office does prescribe Diflucan after 2nd trimester of pregnancy or Terconazole cream.  Assured pt that her concerns are heard and offered topical prescription instead of Diflucan.  Terconazole ordered to pt pharmacy.  Pt states she tried otc Monistat 3 vaginally last night and experienced burning/pain.  Advised pt that Monistat 7 otc is recommend by provider if she wishes to use over the counter medication instead of prescription.  Pt verbalized understanding and had no further questions at this time.    [place], RN

## 2024-07-11 NOTE — Telephone Encounter (Signed)
 Patient reviewed test results and MyChart and says she has a yeast infection and has not gotten a call about her test results and has not gotten any medication for it. She says she has sent messages through MyChart and has not gotten a response. She is needing medication asap because she is 37 wks.

## 2024-07-11 NOTE — Addendum Note (Signed)
 Addended by: ELBY WADDELL CROME on: 07/11/2024 11:18 AM   Modules accepted: Orders

## 2024-07-12 ENCOUNTER — Encounter: Admitting: Obstetrics and Gynecology

## 2024-07-13 ENCOUNTER — Ambulatory Visit: Attending: Maternal & Fetal Medicine

## 2024-07-13 ENCOUNTER — Ambulatory Visit

## 2024-07-13 ENCOUNTER — Encounter: Payer: Self-pay | Admitting: Certified Nurse Midwife

## 2024-07-13 ENCOUNTER — Ambulatory Visit: Admitting: Certified Nurse Midwife

## 2024-07-13 ENCOUNTER — Other Ambulatory Visit: Payer: Self-pay

## 2024-07-13 VITALS — BP 109/73 | HR 91 | Wt 125.0 lb

## 2024-07-13 VITALS — BP 122/81 | HR 93

## 2024-07-13 DIAGNOSIS — O3483 Maternal care for other abnormalities of pelvic organs, third trimester: Secondary | ICD-10-CM | POA: Insufficient documentation

## 2024-07-13 DIAGNOSIS — O26843 Uterine size-date discrepancy, third trimester: Secondary | ICD-10-CM

## 2024-07-13 DIAGNOSIS — N83209 Unspecified ovarian cyst, unspecified side: Secondary | ICD-10-CM | POA: Diagnosis not present

## 2024-07-13 DIAGNOSIS — Z3493 Encounter for supervision of normal pregnancy, unspecified, third trimester: Secondary | ICD-10-CM

## 2024-07-13 DIAGNOSIS — D509 Iron deficiency anemia, unspecified: Secondary | ICD-10-CM

## 2024-07-13 DIAGNOSIS — B3731 Acute candidiasis of vulva and vagina: Secondary | ICD-10-CM

## 2024-07-13 DIAGNOSIS — O99013 Anemia complicating pregnancy, third trimester: Secondary | ICD-10-CM | POA: Diagnosis not present

## 2024-07-13 DIAGNOSIS — Z3A37 37 weeks gestation of pregnancy: Secondary | ICD-10-CM | POA: Diagnosis not present

## 2024-07-13 LAB — CBC
Hematocrit: 31.7 % — ABNORMAL LOW (ref 34.0–46.6)
Hemoglobin: 10.5 g/dL — ABNORMAL LOW (ref 11.1–15.9)
MCH: 31.3 pg (ref 26.6–33.0)
MCHC: 33.1 g/dL (ref 31.5–35.7)
MCV: 95 fL (ref 79–97)
Platelets: 195 x10E3/uL (ref 150–450)
RBC: 3.35 x10E6/uL — ABNORMAL LOW (ref 3.77–5.28)
RDW: 13.1 % (ref 11.7–15.4)
WBC: 7.7 x10E3/uL (ref 3.4–10.8)

## 2024-07-13 NOTE — Progress Notes (Signed)
   PRENATAL VISIT NOTE  Subjective:  Brittany Roman is a 23 y.o. G2P0010 at [redacted]w[redacted]d being seen today for ongoing prenatal care.  She is currently monitored for the following issues for this low-risk pregnancy and has Supervision of low-risk pregnancy; Rubella non-immune status, antepartum; Choroid plexus cyst; and Iron deficiency anemia of pregnancy on their problem list.  Patient reports no complaints.  Contractions: Not present. Vag. Bleeding: None.  Movement: Present. Denies leaking of fluid.   The following portions of the patient's history were reviewed and updated as appropriate: allergies, current medications, past family history, past medical history, past social history, past surgical history and problem list.   Objective:    Vitals:   07/13/24 1017  BP: 109/73  Pulse: 91  Weight: 125 lb (56.7 kg)    Fetal Status:  Fetal Heart Rate (bpm): 150 Fundal Height: 34 cm Movement: Present Presentation: Vertex  General: Alert, oriented and cooperative. Patient is in no acute distress.  Skin: Skin is warm and dry. No rash noted.   Cardiovascular: Normal heart rate noted  Respiratory: Normal respiratory effort, no problems with respiration noted  Abdomen: Soft, gravid, appropriate for gestational age.  Pain/Pressure: Absent     Pelvic: Cervical exam deferred        Extremities: Normal range of motion.  Edema: None  Mental Status: Normal mood and affect. Normal behavior. Normal judgment and thought content.   Assessment and Plan:  Pregnancy: G2P0010 at [redacted]w[redacted]d 1. Encounter for supervision of low-risk pregnancy in third trimester (Primary) - Doing well, feeling regular and vigorous fetal movement   2. [redacted] weeks gestation of pregnancy - Routine OB care   3. Yeast vaginitis - Diflucan discontinued by patient after internet sources recommended not taking it in pregnancy - Tried the one day 3-day dose of monistat but it burned so she switched to 7-day regimen  4. Uterine size-date  discrepancy in third trimester - FH<dates, U/S ordered but MFM unable to see her until 10/22. MGM insistent on U/S being completed prior to her [place] trip this weekend, so pt sent to [facility] for imaging today - US  MFM OB FOLLOW UP; Future - U/S showed 37%ile, all else normal other than maternal ovarian cyst  5. Iron deficiency anemia of pregnancy - Has been taking oral iron since anemia diagnosed, would like it rechecked - CBC  Term labor symptoms and general obstetric precautions including but not limited to vaginal bleeding, contractions, leaking of fluid and fetal movement were reviewed in detail with the patient. Please refer to After Visit Summary for other counseling recommendations.   Return in about 1 week (around 07/20/2024) for IN-PERSON, LOB.  Future Appointments  Date Time Provider Department Center  07/22/2024  9:15 AM Emilio Delilah CHRISTELLA EDDY [facility]  07/27/2024  8:35 AM Vannie Cornell SAUNDERS, CNM [facility]  08/02/2024  9:35 AM Regino, Camie LABOR, CNM [facility]   Cornell SAUNDERS Vannie, CNM

## 2024-07-14 ENCOUNTER — Encounter: Admitting: Obstetrics and Gynecology

## 2024-07-19 ENCOUNTER — Encounter: Admitting: Physician Assistant

## 2024-07-19 ENCOUNTER — Telehealth: Payer: Self-pay

## 2024-07-19 NOTE — Telephone Encounter (Signed)
 Called Pt to address concern of having pain in her belly button area, advised that you will have some tenderness due to stretching of skin, advised that she has appt on Friday the 17th and we will address it then. Pt verbalized understanding.

## 2024-07-19 NOTE — Telephone Encounter (Signed)
 Pt called Nurse voice line regarding pain in her belly button area.

## 2024-07-22 ENCOUNTER — Other Ambulatory Visit: Payer: Self-pay

## 2024-07-22 ENCOUNTER — Ambulatory Visit (INDEPENDENT_AMBULATORY_CARE_PROVIDER_SITE_OTHER): Admitting: Certified Nurse Midwife

## 2024-07-22 VITALS — BP 115/73 | HR 107 | Wt 127.0 lb

## 2024-07-22 DIAGNOSIS — Z3493 Encounter for supervision of normal pregnancy, unspecified, third trimester: Secondary | ICD-10-CM

## 2024-07-22 DIAGNOSIS — Z3A38 38 weeks gestation of pregnancy: Secondary | ICD-10-CM | POA: Diagnosis not present

## 2024-07-22 DIAGNOSIS — B379 Candidiasis, unspecified: Secondary | ICD-10-CM

## 2024-07-22 DIAGNOSIS — R1084 Generalized abdominal pain: Secondary | ICD-10-CM

## 2024-07-24 NOTE — Progress Notes (Signed)
   PRENATAL VISIT NOTE  Subjective:  Brittany Roman is a 23 y.o. G2P0010 at [redacted]w[redacted]d being seen today for ongoing prenatal care.  She is currently monitored for the following issues for this low-risk pregnancy and has Supervision of low-risk pregnancy; Rubella non-immune status, antepartum; Choroid plexus cyst; and Iron deficiency anemia of pregnancy on their problem list.  Patient reports a little bit of soreness to the outer edge of her belly button. She denies worsening or alleviating symptoms. Patient is concerned for a umbilical hernia. Pt also desires to discuss the MFM US  and recommendation for delivery between 39-40 weeks.   .  Contractions: Irritability. Vag. Bleeding: None.  Movement: Present. Denies leaking of fluid.   The following portions of the patient's history were reviewed and updated as appropriate: allergies, current medications, past family history, past medical history, past social history, past surgical history and problem list.   Objective:    Vitals:   07/22/24 0918  BP: 115/73  Pulse: (!) 107  Weight: 127 lb (57.6 kg)    Fetal Status:  Fetal Heart Rate (bpm): 143   Movement: Present    General: Alert, oriented and cooperative. Patient is in no acute distress.  Skin: Skin is warm and dry. No rash noted.   Cardiovascular: Normal heart rate noted  Respiratory: Normal respiratory effort, no problems with respiration noted  Abdomen: Soft, gravid, appropriate for gestational age.  Pain/Pressure: Absent     Pelvic: Cervical exam deferred        Extremities: Normal range of motion.  Edema: None  Mental Status: Normal mood and affect. Normal behavior. Normal judgment and thought content.   Assessment and Plan:  Pregnancy: G2P0010 at [redacted]w[redacted]d 1. Encounter for supervision of low-risk pregnancy in third trimester (Primary) - Patient is overall doing well. - Reports vigorous and frequent fetal movement.   2. [redacted] weeks gestation of pregnancy - Reviewed Ultrasound results with  patient.  - Discussed that IOL may not be necessary at this time. The hope is that patient will spontaneously labor prior to 40 weeks.   3. Yeast infection - Patient just finished treatment for yeast and request a TOC.  - Reviewed time frame for re-testing. Plan to retest in the upcoming weeks unless still having symptoms.   4. Abdominal pain, generalized - Discussed how the abdominal muscles stretch to accommodate the pregnancy and the expectations associated with it.  - Reviewed worsening signs and symptoms.   Term labor symptoms and general obstetric precautions including but not limited to vaginal bleeding, contractions, leaking of fluid and fetal movement were reviewed in detail with the patient. Please refer to After Visit Summary for other counseling recommendations.   Return in about 1 week (around 07/29/2024) for LOB with Georgetta .  Future Appointments  Date Time Provider Department Center  07/27/2024  8:35 AM Vannie Cornell JONELLE EDDY [facility]  08/02/2024  9:35 AM Warren-Hill, Camie LABOR, CNM [facility]    Ellowyn Rieves Erven) Emilio, MSN, CNM  Center for [facility]  07/24/2024 10:50 AM

## 2024-07-25 ENCOUNTER — Ambulatory Visit

## 2024-07-26 ENCOUNTER — Encounter: Admitting: Physician Assistant

## 2024-07-27 ENCOUNTER — Other Ambulatory Visit: Payer: Self-pay

## 2024-07-27 ENCOUNTER — Ambulatory Visit: Admitting: Certified Nurse Midwife

## 2024-07-27 VITALS — BP 121/82 | HR 98 | Wt 129.0 lb

## 2024-07-27 DIAGNOSIS — Z3493 Encounter for supervision of normal pregnancy, unspecified, third trimester: Secondary | ICD-10-CM

## 2024-07-27 DIAGNOSIS — Z3A39 39 weeks gestation of pregnancy: Secondary | ICD-10-CM

## 2024-07-27 NOTE — Progress Notes (Addendum)
   PRENATAL VISIT NOTE  Subjective:  Brittany Roman is a 23 y.o. G2P0010 at [redacted]w[redacted]d being seen today for ongoing prenatal care.  She is currently monitored for the following issues for this low-risk pregnancy and has Supervision of low-risk pregnancy; Rubella non-immune status, antepartum; Choroid plexus cyst; and Iron deficiency anemia of pregnancy on their problem list.  Patient reports occasional Braxton Hicks contractions.  Contractions: Irregular. Vag. Bleeding: None.  Movement: Present. Denies leaking of fluid.   The following portions of the patient's history were reviewed and updated as appropriate: allergies, current medications, past family history, past medical history, past social history, past surgical history and problem list.   Objective:   Vitals:   07/27/24 0837  BP: 121/82  Pulse: 98  Weight: 129 lb (58.5 kg)   Fetal Status:  Fetal Heart Rate (bpm): 148 Fundal Height: 37 cm Movement: Present Presentation: Vertex  General: Alert, oriented and cooperative. Patient is in no acute distress.  Skin: Skin is warm and dry. No rash noted.   Cardiovascular: Normal heart rate noted  Respiratory: Normal respiratory effort, no problems with respiration noted  Abdomen: Soft, gravid, appropriate for gestational age.  Pain/Pressure: Absent     Pelvic: Cervical exam performed in the presence of a chaperone Dilation: Fingertip Effacement (%): 50 Station: -2  Extremities: Normal range of motion.  Edema: None  Mental Status: Normal mood and affect. Normal behavior. Normal judgment and thought content.   Assessment and Plan:  Pregnancy: G2P0010 at [redacted]w[redacted]d 1. Encounter for supervision of low-risk pregnancy in third trimester (Primary) - Doing well, feeling regular and vigorous fetal movement   2. [redacted] weeks gestation of pregnancy - Routine OB care   Term labor symptoms and general obstetric precautions including but not limited to vaginal bleeding, contractions, leaking of fluid and fetal  movement were reviewed in detail with the patient. Please refer to After Visit Summary for other counseling recommendations.   Future Appointments  Date Time Provider Department Center  08/02/2024  9:35 AM Regino, Camie DELENA HOWARD [facility]    Tommy Daring, NP-S

## 2024-08-01 NOTE — Progress Notes (Unsigned)
   PRENATAL VISIT NOTE  Subjective:  Brittany Roman is a 23 y.o. G2P0010 at [redacted]w[redacted]d being seen today for ongoing prenatal care.  She is currently monitored for the following issues for this low-risk pregnancy and has Supervision of low-risk pregnancy; Rubella non-immune status, antepartum; Choroid plexus cyst; and Iron deficiency anemia of pregnancy on their problem list.  Patient reports {[id number]}.   .  .   . Denies leaking of fluid.   The following portions of the patient's history were reviewed and updated as appropriate: allergies, current medications, past family history, past medical history, past social history, past surgical history and problem list.   Objective:    There were no vitals filed for this visit.  Fetal Status:           General: Alert, oriented and cooperative. Patient is in no acute distress.  Skin: Skin is warm and dry. No rash noted.   Cardiovascular: Normal heart rate noted  Respiratory: Normal respiratory effort, no problems with respiration noted  Abdomen: Soft, gravid, appropriate for gestational age.        Pelvic: Cervical exam deferred        Extremities: Normal range of motion.     Mental Status: Normal mood and affect. Normal behavior. Normal judgment and thought content.   Assessment and Plan:  Pregnancy: G2P0010 at [redacted]w[redacted]d 1. Encounter for supervision of low-risk pregnancy in third trimester (Primary) - Doing well, feeling regular and vigorous fetal movement   2. [redacted] weeks gestation of pregnancy - Discussed membrane sweeping today. Reviewed cochrane review data on membrane sweeping at 39 wks and then at EDD. Reviewed risk of cramping, contractions, bleeding and ROM. Answered patient questions and she agreed to proceed with procedure.  - Routine PNC, anticipatory guidance.    Term labor symptoms and general obstetric precautions including but not limited to vaginal bleeding, contractions, leaking of fluid and fetal movement were reviewed in detail with  the patient. Please refer to After Visit Summary for other counseling recommendations.   No follow-ups on file.  Future Appointments  Date Time Provider Department Center  08/02/2024  9:35 AM Regino, Camie DELENA HOWARD [facility]    Camie DELENA Regino, CNM

## 2024-08-02 ENCOUNTER — Other Ambulatory Visit: Payer: Self-pay

## 2024-08-02 ENCOUNTER — Other Ambulatory Visit: Payer: Self-pay | Admitting: Certified Nurse Midwife

## 2024-08-02 ENCOUNTER — Ambulatory Visit: Admitting: Certified Nurse Midwife

## 2024-08-02 VITALS — BP 116/86 | HR 101 | Wt 130.9 lb

## 2024-08-02 DIAGNOSIS — Z3490 Encounter for supervision of normal pregnancy, unspecified, unspecified trimester: Secondary | ICD-10-CM

## 2024-08-02 DIAGNOSIS — Z3A39 39 weeks gestation of pregnancy: Secondary | ICD-10-CM

## 2024-08-02 DIAGNOSIS — Z3493 Encounter for supervision of normal pregnancy, unspecified, third trimester: Secondary | ICD-10-CM

## 2024-08-02 NOTE — Progress Notes (Signed)
 IOL ON 08/10/24-Day

## 2024-08-03 ENCOUNTER — Telehealth (HOSPITAL_COMMUNITY): Payer: Self-pay | Admitting: *Deleted

## 2024-08-03 ENCOUNTER — Encounter (HOSPITAL_COMMUNITY): Payer: Self-pay

## 2024-08-03 ENCOUNTER — Encounter: Admitting: Family Medicine

## 2024-08-03 NOTE — Telephone Encounter (Signed)
 Preadmission screen

## 2024-08-04 ENCOUNTER — Telehealth (HOSPITAL_COMMUNITY): Payer: Self-pay | Admitting: *Deleted

## 2024-08-04 ENCOUNTER — Encounter (HOSPITAL_COMMUNITY): Payer: Self-pay | Admitting: *Deleted

## 2024-08-04 NOTE — Telephone Encounter (Signed)
 Preadmission screen

## 2024-08-06 ENCOUNTER — Encounter (HOSPITAL_COMMUNITY): Payer: Self-pay | Admitting: Obstetrics and Gynecology

## 2024-08-06 ENCOUNTER — Inpatient Hospital Stay (HOSPITAL_COMMUNITY)
Admission: AD | Admit: 2024-08-06 | Discharge: 2024-08-06 | Disposition: A | Discharge status: Home / Self Care | Attending: Obstetrics and Gynecology | Admitting: Obstetrics and Gynecology

## 2024-08-06 DIAGNOSIS — O479 False labor, unspecified: Secondary | ICD-10-CM

## 2024-08-06 DIAGNOSIS — Z3A40 40 weeks gestation of pregnancy: Secondary | ICD-10-CM | POA: Diagnosis not present

## 2024-08-06 LAB — POCT FERN TEST: POCT Fern Test: NEGATIVE

## 2024-08-06 NOTE — Progress Notes (Signed)
 Patient was assessed for active labor and managed by nursing staff during this encounter. I have reviewed the chart and agree with the documentation and plan. I have also reviewed the NST for appropriate reactivity.  Fetal Tracing: Baseline: 125 Variability: moderate Accelerations: yes  Decelerations: no Toco: uterine contractions every 7 minutes   Dilation: Closed Effacement (%): 50 Cervical Position: Posterior Station: Ballotable Exam by:: Greig Sermon, RN  Patient stable for discharge home with labor precautions.  Joesph Sear, PA-C  Center for [employer], [facility]

## 2024-08-06 NOTE — MAU Note (Signed)
 Pt says this morning- had vag D/C - then at 1030 tonight had gush of fluid - clear . Feels some fluid still coming out.  Last sex- this am UC- mild cramps- 3/10. Feels baby moving  Denies HSV GBS- neg  [facility]- clinic

## 2024-08-07 ENCOUNTER — Inpatient Hospital Stay (HOSPITAL_COMMUNITY)
Admission: AD | Admit: 2024-08-07 | Discharge: 2024-08-09 | DRG: 806 | Disposition: A | Attending: Family Medicine | Admitting: Family Medicine

## 2024-08-07 ENCOUNTER — Inpatient Hospital Stay (HOSPITAL_COMMUNITY)
Admission: AD | Admit: 2024-08-07 | Discharge: 2024-08-07 | Disposition: A | Discharge status: Home / Self Care | Attending: Obstetrics and Gynecology | Admitting: Obstetrics and Gynecology

## 2024-08-07 ENCOUNTER — Encounter (HOSPITAL_COMMUNITY): Payer: Self-pay | Admitting: Family Medicine

## 2024-08-07 ENCOUNTER — Other Ambulatory Visit: Payer: Self-pay

## 2024-08-07 ENCOUNTER — Inpatient Hospital Stay (HOSPITAL_COMMUNITY): Admitting: Anesthesiology

## 2024-08-07 ENCOUNTER — Encounter (HOSPITAL_COMMUNITY): Payer: Self-pay | Admitting: Obstetrics and Gynecology

## 2024-08-07 DIAGNOSIS — O26893 Other specified pregnancy related conditions, third trimester: Secondary | ICD-10-CM | POA: Diagnosis present

## 2024-08-07 DIAGNOSIS — Z2839 Other underimmunization status: Secondary | ICD-10-CM

## 2024-08-07 DIAGNOSIS — D509 Iron deficiency anemia, unspecified: Secondary | ICD-10-CM

## 2024-08-07 DIAGNOSIS — O864 Pyrexia of unknown origin following delivery: Secondary | ICD-10-CM | POA: Diagnosis not present

## 2024-08-07 DIAGNOSIS — G930 Cerebral cysts: Secondary | ICD-10-CM | POA: Diagnosis present

## 2024-08-07 DIAGNOSIS — Z3A40 40 weeks gestation of pregnancy: Secondary | ICD-10-CM | POA: Insufficient documentation

## 2024-08-07 DIAGNOSIS — O471 False labor at or after 37 completed weeks of gestation: Secondary | ICD-10-CM | POA: Insufficient documentation

## 2024-08-07 DIAGNOSIS — Z3490 Encounter for supervision of normal pregnancy, unspecified, unspecified trimester: Secondary | ICD-10-CM | POA: Insufficient documentation

## 2024-08-07 DIAGNOSIS — O9902 Anemia complicating childbirth: Secondary | ICD-10-CM | POA: Diagnosis present

## 2024-08-07 DIAGNOSIS — Z3493 Encounter for supervision of normal pregnancy, unspecified, third trimester: Secondary | ICD-10-CM

## 2024-08-07 DIAGNOSIS — O358XX0 Maternal care for other (suspected) fetal abnormality and damage, not applicable or unspecified: Secondary | ICD-10-CM | POA: Diagnosis present

## 2024-08-07 DIAGNOSIS — O480 Post-term pregnancy: Secondary | ICD-10-CM | POA: Diagnosis not present

## 2024-08-07 DIAGNOSIS — O479 False labor, unspecified: Secondary | ICD-10-CM

## 2024-08-07 LAB — CBC
HCT: 33.4 % — ABNORMAL LOW (ref 36.0–46.0)
Hemoglobin: 11.4 g/dL — ABNORMAL LOW (ref 12.0–15.0)
MCH: 31 pg (ref 26.0–34.0)
MCHC: 34.1 g/dL (ref 30.0–36.0)
MCV: 90.8 fL (ref 80.0–100.0)
Platelets: 172 K/uL (ref 150–400)
RBC: 3.68 MIL/uL — ABNORMAL LOW (ref 3.87–5.11)
RDW: 13.3 % (ref 11.5–15.5)
WBC: 13.7 K/uL — ABNORMAL HIGH (ref 4.0–10.5)
nRBC: 0 % (ref 0.0–0.2)

## 2024-08-07 LAB — POCT FERN TEST: POCT Fern Test: NEGATIVE

## 2024-08-07 LAB — TYPE AND SCREEN
ABO/RH(D): A POS
Antibody Screen: NEGATIVE

## 2024-08-07 LAB — RPR: RPR Ser Ql: NONREACTIVE

## 2024-08-07 MED ORDER — SODIUM CHLORIDE 0.9% FLUSH: 3.0000 mL | Freq: Two times a day (BID) | INTRAVENOUS | Status: DC

## 2024-08-07 MED ORDER — EPHEDRINE 5 MG/ML INJ: 10.0000 mg | INTRAVENOUS | Status: DC | PRN

## 2024-08-07 MED ORDER — LACTATED RINGERS IV SOLN
500.0000 mL | Freq: Once | INTRAVENOUS | Status: AC
  Administered 2024-08-07: 500 mL via INTRAVENOUS

## 2024-08-07 MED ORDER — ACETAMINOPHEN 325 MG PO TABS
650.0000 mg | ORAL_TABLET | ORAL | Status: DC | PRN
  Administered 2024-08-07: 650 mg via ORAL
  Filled 2024-08-07: qty 2

## 2024-08-07 MED ORDER — ONDANSETRON HCL 4 MG/2ML IJ SOLN: 4.0000 mg | INTRAMUSCULAR | Status: DC | PRN

## 2024-08-07 MED ORDER — FENTANYL-BUPIVACAINE-NACL 0.5-0.125-0.9 MG/250ML-% EP SOLN: 12.0000 mL/h | EPIDURAL | Status: DC | PRN

## 2024-08-07 MED ORDER — LACTATED RINGERS IV SOLN: 500.0000 mL | INTRAVENOUS | Status: DC | PRN

## 2024-08-07 MED ORDER — OXYTOCIN-SODIUM CHLORIDE 30-0.9 UT/500ML-% IV SOLN
2.5000 [IU]/h | INTRAVENOUS | Status: DC
  Administered 2024-08-07: 2.5 [IU]/h via INTRAVENOUS
  Filled 2024-08-07: qty 500

## 2024-08-07 MED ORDER — FENTANYL-BUPIVACAINE-NACL 0.5-0.125-0.9 MG/250ML-% EP SOLN
12.0000 mL/h | EPIDURAL | Status: DC | PRN
  Administered 2024-08-07: 12 mL/h via EPIDURAL

## 2024-08-07 MED ORDER — DIBUCAINE (PERIANAL) 1 % EX OINT: 1.0000 | TOPICAL_OINTMENT | CUTANEOUS | Status: DC | PRN

## 2024-08-07 MED ORDER — LACTATED RINGERS IV SOLN: 500.0000 mL | Freq: Once | INTRAVENOUS | Status: DC

## 2024-08-07 MED ORDER — MEASLES, MUMPS & RUBELLA VAC ~~LOC~~ SUSR: 0.5000 mL | Freq: Once | SUBCUTANEOUS | Status: DC

## 2024-08-07 MED ORDER — MORPHINE SULFATE (PF) 4 MG/ML IV SOLN
6.0000 mg | Freq: Once | INTRAVENOUS | Status: AC
  Administered 2024-08-07: 6 mg via INTRAMUSCULAR
  Filled 2024-08-07: qty 2

## 2024-08-07 MED ORDER — SODIUM CHLORIDE 0.9% FLUSH: 3.0000 mL | INTRAVENOUS | Status: DC | PRN

## 2024-08-07 MED ORDER — MAGNESIUM HYDROXIDE 400 MG/5ML PO SUSP: 30.0000 mL | Freq: Every day | ORAL | Status: DC | PRN

## 2024-08-07 MED ORDER — BENZOCAINE-MENTHOL 20-0.5 % EX AERO: 1.0000 | INHALATION_SPRAY | CUTANEOUS | Status: DC | PRN

## 2024-08-07 MED ORDER — OXYCODONE-ACETAMINOPHEN 5-325 MG PO TABS: 2.0000 | ORAL_TABLET | ORAL | Status: DC | PRN

## 2024-08-07 MED ORDER — WITCH HAZEL-GLYCERIN EX PADS: 1.0000 | MEDICATED_PAD | CUTANEOUS | Status: DC | PRN

## 2024-08-07 MED ORDER — PRENATAL MULTIVITAMIN CH
1.0000 | ORAL_TABLET | Freq: Every day | ORAL | Status: DC
Start: 2024-08-08 — End: 2024-08-09
  Administered 2024-08-08 – 2024-08-09 (×2): 1 via ORAL
  Filled 2024-08-07 (×2): qty 1

## 2024-08-07 MED ORDER — DIPHENHYDRAMINE HCL 50 MG/ML IJ SOLN: 12.5000 mg | INTRAMUSCULAR | Status: DC | PRN

## 2024-08-07 MED ORDER — LACTATED RINGERS IV SOLN: INTRAVENOUS | Status: DC

## 2024-08-07 MED ORDER — OXYTOCIN-SODIUM CHLORIDE 30-0.9 UT/500ML-% IV SOLN: 1.0000 m[IU]/min | INTRAVENOUS | Status: DC

## 2024-08-07 MED ORDER — TERBUTALINE SULFATE 1 MG/ML IJ SOLN: 0.2500 mg | Freq: Once | INTRAMUSCULAR | Status: DC | PRN

## 2024-08-07 MED ORDER — SENNOSIDES-DOCUSATE SODIUM 8.6-50 MG PO TABS: 2.0000 | ORAL_TABLET | Freq: Every evening | ORAL | Status: DC | PRN

## 2024-08-07 MED ORDER — FENTANYL-BUPIVACAINE-NACL 0.5-0.125-0.9 MG/250ML-% EP SOLN
EPIDURAL | Status: AC
  Filled 2024-08-07: qty 250

## 2024-08-07 MED ORDER — HYDROXYZINE HCL 50 MG PO TABS: 50.0000 mg | ORAL_TABLET | Freq: Four times a day (QID) | ORAL | Status: DC | PRN

## 2024-08-07 MED ORDER — OXYCODONE-ACETAMINOPHEN 5-325 MG PO TABS: 1.0000 | ORAL_TABLET | ORAL | Status: DC | PRN

## 2024-08-07 MED ORDER — MISOPROSTOL 25 MCG QUARTER TABLET: 25.0000 ug | ORAL_TABLET | Freq: Once | ORAL | Status: DC

## 2024-08-07 MED ORDER — COCONUT OIL OIL: 1.0000 | TOPICAL_OIL | Status: DC | PRN

## 2024-08-07 MED ORDER — SIMETHICONE 80 MG PO CHEW: 80.0000 mg | CHEWABLE_TABLET | ORAL | Status: DC | PRN

## 2024-08-07 MED ORDER — TETANUS-DIPHTH-ACELL PERTUSSIS 5-2-15.5 LF-MCG/0.5 IM SUSP: 0.5000 mL | Freq: Once | INTRAMUSCULAR | Status: DC

## 2024-08-07 MED ORDER — FENTANYL CITRATE (PF) 100 MCG/2ML IJ SOLN: 100.0000 ug | INTRAMUSCULAR | Status: DC | PRN

## 2024-08-07 MED ORDER — OXYTOCIN BOLUS FROM INFUSION
333.0000 mL | Freq: Once | INTRAVENOUS | Status: AC
  Administered 2024-08-07: 333 mL via INTRAVENOUS

## 2024-08-07 MED ORDER — SOD CITRATE-CITRIC ACID 500-334 MG/5ML PO SOLN: 30.0000 mL | ORAL | Status: DC | PRN

## 2024-08-07 MED ORDER — FENTANYL CITRATE (PF) 100 MCG/2ML IJ SOLN
50.0000 ug | Freq: Once | INTRAMUSCULAR | Status: AC
  Administered 2024-08-07: 50 ug via INTRAVENOUS
  Filled 2024-08-07: qty 2

## 2024-08-07 MED ORDER — PHENYLEPHRINE 80 MCG/ML (10ML) SYRINGE FOR IV PUSH (FOR BLOOD PRESSURE SUPPORT): 80.0000 ug | PREFILLED_SYRINGE | INTRAVENOUS | Status: DC | PRN

## 2024-08-07 MED ORDER — MISOPROSTOL 50MCG HALF TABLET: 50.0000 ug | ORAL_TABLET | Freq: Once | ORAL | Status: DC

## 2024-08-07 MED ORDER — LIDOCAINE HCL (PF) 1 % IJ SOLN: 30.0000 mL | INTRAMUSCULAR | Status: DC | PRN

## 2024-08-07 MED ORDER — ONDANSETRON HCL 4 MG PO TABS: 4.0000 mg | ORAL_TABLET | ORAL | Status: DC | PRN

## 2024-08-07 MED ORDER — ACETAMINOPHEN 500 MG PO TABS
1000.0000 mg | ORAL_TABLET | Freq: Four times a day (QID) | ORAL | Status: DC
  Administered 2024-08-07 – 2024-08-09 (×7): 1000 mg via ORAL
  Filled 2024-08-07 (×7): qty 2

## 2024-08-07 MED ORDER — DIPHENHYDRAMINE HCL 25 MG PO CAPS: 25.0000 mg | ORAL_CAPSULE | Freq: Four times a day (QID) | ORAL | Status: DC | PRN

## 2024-08-07 MED ORDER — ONDANSETRON HCL 4 MG/2ML IJ SOLN: 4.0000 mg | Freq: Four times a day (QID) | INTRAMUSCULAR | Status: DC | PRN

## 2024-08-07 MED ORDER — SODIUM CHLORIDE 0.9 % IV SOLN: 250.0000 mL | INTRAVENOUS | Status: DC | PRN

## 2024-08-07 MED ORDER — IBUPROFEN 600 MG PO TABS
600.0000 mg | ORAL_TABLET | Freq: Four times a day (QID) | ORAL | Status: DC
  Administered 2024-08-07 – 2024-08-09 (×8): 600 mg via ORAL
  Filled 2024-08-07 (×8): qty 1

## 2024-08-07 MED ORDER — LIDOCAINE-EPINEPHRINE (PF) 2 %-1:200000 IJ SOLN
INTRAMUSCULAR | Status: DC | PRN
  Administered 2024-08-07: 3 mL via EPIDURAL

## 2024-08-07 NOTE — Discharge Summary (Signed)
 Postpartum Discharge Summary  Date of Service updated***     Patient Name: Brittany Roman DOB: [birth year] MRN: [id number]  Date of admission: 08/07/2024 Delivery date:08/07/2024 Delivering provider: DANNY GERALDS Date of discharge: 08/07/2024  Admitting diagnosis:   Indication for care in labor or delivery Intrauterine pregnancy: [redacted]w[redacted]d     Secondary diagnosis:  Principal Problem:   Indication for care in labor or delivery Active Problems:   Rubella non-immune status, antepartum   Choroid plexus cyst   Iron deficiency anemia of pregnancy  Additional problems: ***    Discharge diagnosis: Term Pregnancy Delivered                                              Post partum procedures:{Postpartum procedures:23558} Augmentation: none Complications: None  Hospital course: Onset of Labor With Vaginal Delivery      23 y.o. yo G2P0010 at [redacted]w[redacted]d was admitted in Latent Labor on 08/07/2024. Labor course was complicated by none  Membrane Rupture Time/Date: 10:58 AM,08/07/2024  Delivery Method:Vaginal, Spontaneous Operative Delivery:N/A Episiotomy: None Lacerations:  Labial;1st degree;Perineal  Patient had a postpartum course complicated by ***.  She is ambulating, tolerating a regular diet, passing flatus, and urinating well. Patient is discharged home in stable condition on 08/07/24.  Newborn Data: Birth date:08/07/2024 Birth time:2:39 PM Gender:Female Living status:Living Apgars:8 ,9  Weight:3490 g  Magnesium Sulfate received: {Mag received:30440022} BMZ received: No Rhophylac:N/A MMR:{MMR:30440033} ***needs PP T-DaP:Given prenatally Flu: {Qol:76036} offer PP RSV Vaccine received: No Transfusion:{Transfusion received:30440034}  Immunizations received: Immunization History  Administered Date(s) Administered   PFIZER(Purple Top)SARS-COV-2 Vaccination 04/14/2020, 05/05/2020   Tdap 11/11/2023, 05/25/2024    Physical exam  Vitals:   08/07/24 1546 08/07/24 1600 08/07/24 1612  08/07/24 1616  BP: 124/80 120/89  116/79  Pulse: 95 99  99  Resp:      Temp:   98.2 F (36.8 C)   TempSrc:   Axillary   SpO2:       General: {Exam; general:21111117} Lochia: {Desc; appropriate/inappropriate:30686::appropriate} Uterine Fundus: {Desc; firm/soft:30687} Incision: {Exam; incision:21111123} DVT Evaluation: {Exam; dvt:2111122} Labs: Lab Results  Component Value Date   WBC 13.7 (H) 08/07/2024   HGB 11.4 (L) 08/07/2024   HCT 33.4 (L) 08/07/2024   MCV 90.8 08/07/2024   PLT 172 08/07/2024       No data to display         Edinburgh Score:     No data to display         No data recorded  After visit meds:  Allergies as of 08/07/2024   No Known Allergies   Med Rec must be completed prior to using this [facility]***        Discharge home in stable condition Infant Feeding: {Baby feeding:23562} Infant Disposition:{CHL IP OB HOME WITH FNUYZM:76418} Discharge instruction: per After Visit Summary and Postpartum booklet. Activity: Advance as tolerated. Pelvic rest for 6 weeks.  Diet: {OB ipzu:78888878} Future Appointments: Future Appointments  Date Time Provider Department Center  08/09/2024  2:55 PM Nicholaus Burnard CHRISTELLA, MD [facility]  08/10/2024  6:45 AM [facility] SCHED ROOM [facility] None   Follow up Visit: Message sent 08/07/2024   Please schedule this patient for a In person postpartum visit in 4 weeks with the following provider: Any provider. Additional Postpartum F/U:none  Low risk pregnancy complicated by: none Delivery mode:  Vaginal, Spontaneous Anticipated Birth Control:  Condoms   08/07/2024 Barabara Maier, DO

## 2024-08-07 NOTE — H&P (Signed)
 OBSTETRIC ADMISSION HISTORY AND PHYSICAL  Brittany Roman is a 23 y.o. female G2P0010 with IUP at [redacted]w[redacted]d (dated by 6w US , Estimated Date of Delivery: 08/03/24) presenting for SOL.   She reports +FMs, No LOF, no VB, no blurry vision, headaches or peripheral edema, and RUQ pain.    She plans on breast feeding. She is undecided on contraception postpartum.   She received her prenatal care at [facility]   Prenatal History/Complications: N/A  Past Medical History: Past Medical History:  Diagnosis Date   Anxiety     Past Surgical History: Past Surgical History:  Procedure Laterality Date   TONSILLECTOMY      Obstetrical History: OB History     Gravida  2   Para      Term      Preterm      AB  1   Living         SAB  1   IAB      Ectopic      Multiple      Live Births              Social History Social History   Socioeconomic History   Marital status: Single    Spouse name: Not on file   Number of children: Not on file   Years of education: Not on file   Highest education level: Some college, no degree  Occupational History   Not on file  Tobacco Use   Smoking status: Never   Smokeless tobacco: Never  Vaping Use   Vaping status: Former   Substances: Nicotine  Substance and Sexual Activity   Alcohol use: Yes    Comment: Occ   Drug use: Not Currently   Sexual activity: Yes    Birth control/protection: None  Other Topics Concern   Not on file  Social History Narrative   ** Merged History Encounter **       Social Drivers of Health   Financial Resource Strain: Medium Risk (12/07/2023)   Overall Financial Resource Strain (CARDIA)    Difficulty of Paying Living Expenses: Somewhat hard  Food Insecurity: No Food Insecurity (08/07/2024)   Hunger Vital Sign    Worried About Running Out of Food in the Last Year: Never true    Ran Out of Food in the Last Year: Never true  Transportation Needs: No Transportation Needs (08/07/2024)   PRAPARE - Therapist, Art (Medical): No    Lack of Transportation (Non-Medical): No  Physical Activity: Sufficiently Active (12/07/2023)   Exercise Vital Sign    Days of Exercise per Week: 5 days    Minutes of Exercise per Session: 60 min  Stress: No Stress Concern Present (12/07/2023)   [employer] of Occupational Health - Occupational Stress Questionnaire    Feeling of Stress : Only a little  Social Connections: Moderately Integrated (12/07/2023)   Social Connection and Isolation Panel    Frequency of Communication with Friends and Family: More than three times a week    Frequency of Social Gatherings with Friends and Family: More than three times a week    Attends Religious Services: More than 4 times per year    Active Member of [employer] or Organizations: No    Attends Engineer, Structural: Not on file    Marital Status: Living with partner    Family History: Family History  Problem Relation Age of Onset   Cancer Maternal Uncle    Cancer Maternal  Grandmother    Cancer Maternal Grandfather     Allergies: No Known Allergies  Medications Prior to Admission  Medication Sig Dispense Refill Last Dose/Taking   Blood Pressure Monitoring (BLOOD PRESSURE KIT) DEVI 1 Device by Does not apply route as needed. 1 each 0    Misc. Devices (GOJJI WEIGHT SCALE) MISC 1 Device by Does not apply route as needed. 1 each 0    Prenatal Vit-Fe Fumarate-FA (PRENATAL VITAMIN PO) Take 1 tablet by mouth daily at 6 (six) AM.        Review of Systems  All systems reviewed and negative except as stated in HPI.  Blood pressure 129/88, pulse 87, temperature 99 F (37.2 C), temperature source Oral, resp. rate 20, last menstrual period 09/12/2023, SpO2 100%, unknown if currently breastfeeding. General appearance: alert, cooperative, and appears stated age Lungs: breathing comfortably on room air Heart: regular rate Abdomen: soft, non-tender; gravid Extremities: no edema of bilateral lower  extremities DTR's intact Presentation: cephalic Fetal monitoringBaseline: 145 bpm, Variability: Good {> 6 bpm), Accelerations: Reactive, and Decelerations: Absent Uterine activityFrequency: Every 2-3 minutes  Dilation: 4 Effacement (%): 90 Station: -2 Exam by:: Lum Epley, RN   Prenatal labs: ABO, Rh: A/Positive/-- (04/09 1100) Antibody: Negative (04/09 1100) Rubella: <0.90 (04/09 1100) RPR: Non Reactive (08/06 0955)  HBsAg: Negative (04/09 1100)  HIV: Non Reactive (08/06 0955)  GBS: Negative/-- (10/01 1020)  2 hr Glucola normal Genetic screening  NIPS: N/A, AFP: negative Anatomy US  Appears normal Last US : At [redacted]w[redacted]d - cephalic presentation, EFW 2903 (37 %tile), AC 55%  Prenatal Transfer Tool  Maternal Diabetes: No Genetic Screening: Normal Maternal Ultrasounds/Referrals: Normal Fetal Ultrasounds or other Referrals:  None Maternal Substance Abuse:  No Significant Maternal Medications:  None Significant Maternal Lab Results:  Group B Strep negative Number of Prenatal Visits:greater than 3 verified prenatal visits Other Comments:  None  Results for orders placed or performed during the hospital encounter of 08/07/24 (from the past 24 hours)  POCT fern test   Collection Time: 08/07/24  1:15 AM  Result Value Ref Range   POCT Fern Test Negative = intact amniotic membranes     Patient Active Problem List   Diagnosis Date Noted   Iron deficiency anemia of pregnancy 05/19/2024   Choroid plexus cyst 03/09/2024   Rubella non-immune status, antepartum 02/19/2024   Supervision of low-risk pregnancy 01/13/2024    Assessment/Plan:  Brittany Roman is a 23 y.o. G2P0010 at [redacted]w[redacted]d here for SOL. Has been seen in the MAU multiple times since yesterday for labor evaluation. Given 6mg  of morphine for therapeutic rest and made cervical change. Contracting every 2-3 minutes.   #Labor: Expectant management. Desires waterbirth Patient is open to AROM if labor stalls.  #Pain: Per  patient request #FWB: Cat I  #ID:  GBS (-) #MOF: Breast #MOC:Undecided #Circ:  N/A  Barkley Angles, MD OB Fellow, Faculty Practice [facility], Center for [facility] 08/07/2024 5:28 AM

## 2024-08-07 NOTE — MAU Provider Note (Signed)
 MAU labor check     S: [name] is a 23 y.o. G2P0010 at [redacted]w[redacted]d  who presents to MAU today for labor check. She denies vaginal bleeding.  She reports normal fetal movement.    O: LMP 09/12/2023 (Exact Date)  GENERAL: Well-developed, well-nourished female in no acute distress.  HEAD: Normocephalic, atraumatic.  CHEST: Normal effort of breathing, regular heart rate ABDOMEN: Soft, nontender, gravid  Cervical exam: Unchanged     Fetal Monitoring: Baseline: 140 Variability: moderate Accelerations: present Decelerations: absent Contractions: irregular   A: SIUP at [redacted]w[redacted]d  False labor  P: Discharge home with labor precautions  Vishaal Strollo L, MD 08/07/2024 2:55 AM

## 2024-08-07 NOTE — Anesthesia Postprocedure Evaluation (Signed)
 Anesthesia Post Note  Patient: Brittany Roman  Procedure(s) Performed: AN AD HOC LABOR EPIDURAL     Patient location during evaluation: Mother Baby Anesthesia Type: Epidural Level of consciousness: awake and alert and oriented Pain management: satisfactory to patient Vital Signs Assessment: post-procedure vital signs reviewed and stable Respiratory status: respiratory function stable Cardiovascular status: stable Postop Assessment: no headache, no backache, epidural receding, patient able to bend at knees, no signs of nausea or vomiting, adequate PO intake and able to ambulate Anesthetic complications: no Comments: The patient stated that it was the perfect epidural; I'd give Dr. Tilford a 10/10.   No notable events documented.  Last Vitals:  Vitals:   08/07/24 1657 08/07/24 1830  BP: 125/80 118/77  Pulse: 92 82  Resp:    Temp: 36.8 C 37 C  SpO2: 100% 100%    Last Pain:  Vitals:   08/07/24 1831  TempSrc:   PainSc: 3    Pain Goal: Patients Stated Pain Goal: 0 (08/07/24 9350)                 PURNELL PORTAL

## 2024-08-07 NOTE — Anesthesia Preprocedure Evaluation (Addendum)
 Anesthesia Evaluation  Patient identified by MRN, date of birth, ID band Patient awake    Reviewed: Allergy & Precautions, Patient's Chart, lab work & pertinent test results  Airway Mallampati: I       Dental no notable dental hx.    Pulmonary    Pulmonary exam normal        Cardiovascular negative cardio ROS Normal cardiovascular exam     Neuro/Psych   Anxiety        GI/Hepatic   Endo/Other    Renal/GU      Musculoskeletal   Abdominal   Peds  Hematology  (+) Blood dyscrasia, anemia   Anesthesia Other Findings   Reproductive/Obstetrics (+) Pregnancy                              Anesthesia Physical Anesthesia Plan  ASA: 2  Anesthesia Plan: Epidural   Post-op Pain Management:    Induction:   PONV Risk Score and Plan: 0  Airway Management Planned: Natural Airway  Additional Equipment: None  Intra-op Plan:   Post-operative Plan:   Informed Consent: I have reviewed the patients History and Physical, chart, labs and discussed the procedure including the risks, benefits and alternatives for the proposed anesthesia with the patient or authorized representative who has indicated his/her understanding and acceptance.       Plan Discussed with:   Anesthesia Plan Comments: (Lab Results      Component                Value               Date                      WBC                      13.7 (H)            08/07/2024                HGB                      11.4 (L)            08/07/2024                HCT                      33.4 (L)            08/07/2024                MCV                      90.8                08/07/2024                PLT                      172                 08/07/2024           )         Anesthesia Quick Evaluation

## 2024-08-07 NOTE — Anesthesia Procedure Notes (Signed)
 Epidural Patient location during procedure: OB Start time: 08/07/2024 7:26 AM End time: 08/07/2024 7:32 AM  Staffing Anesthesiologist: Tilford Franky BIRCH, MD Performed: anesthesiologist   Preanesthetic Checklist Completed: patient identified, IV checked, site marked, risks and benefits discussed, surgical consent, monitors and equipment checked, pre-op evaluation and timeout performed  Epidural Patient position: sitting Prep: DuraPrep Patient monitoring: heart rate, continuous pulse ox and blood pressure Approach: midline Location: L3-L4 Injection technique: LOR saline  Needle:  Needle type: Tuohy  Needle gauge: 17 G Needle length: 9 cm Catheter type: closed end flexible Catheter size: 20 Guage Test dose: negative and 1.5% lidocaine   Assessment Events: blood not aspirated, no cerebrospinal fluid, injection not painful, no injection resistance and no paresthesia  Additional Notes LOR @ 4  Patient identified. Risks/Benefits/Options discussed with patient including but not limited to bleeding, infection, nerve damage, paralysis, failed block, incomplete pain control, headache, blood pressure changes, nausea, vomiting, reactions to medications, itching and postpartum back pain. Confirmed with bedside nurse the patient's most recent platelet count. Confirmed with patient that they are not currently taking any anticoagulation, have any bleeding history or any family history of bleeding disorders. Patient expressed understanding and wished to proceed. All questions were answered. Sterile technique was used throughout the entire procedure. Please see nursing notes for vital signs. Test dose was given through epidural catheter and negative prior to continuing to dose epidural or start infusion. Warning signs of high block given to the patient including shortness of breath, tingling/numbness in hands, complete motor block, or any concerning symptoms with instructions to call for help. Patient was  given instructions on fall risk and not to get out of bed. All questions and concerns addressed with instructions to call with any issues or inadequate analgesia.    Reason for block:procedure for pain

## 2024-08-07 NOTE — MAU Note (Signed)
 Brittany Roman is a 23 y.o. at [redacted]w[redacted]d here in MAU reporting: regular contractions starting late on 11/1 and increasing in intensity from yesterday. Also reports continued leaking of fluid, though FERN/ROM+ from yesterday was negative.  LMP: 09/12/2023 Onset of complaint: 08/06/2024 Pain score: 8/10 Vitals:   08/07/24 0214  BP: (!) 140/87  Pulse: 85  Resp: 15  Temp: 98.6 F (37 C)  SpO2: 100%  FHT: 130bpm Lab orders placed from triage: RN Labor Evaluation & Rule Out Rupture

## 2024-08-07 NOTE — MAU Note (Signed)
 Brittany Roman is a 23 y.o. at [redacted]w[redacted]d here in MAU reporting: was d/c home from MAU after labor eval - back reporting stronger ctx that are back to back. Reports bloody show. Denies LOF. +FM  Pain score: 10 Vitals:   08/07/24 0355  BP: 129/88  Pulse: 87  Resp: 20  Temp: 99 F (37.2 C)  SpO2: 100%     FHT: 152  Lab orders placed from triage: none

## 2024-08-07 NOTE — Lactation Note (Signed)
 This note was copied from a baby's chart. Lactation Consultation Note  Patient Name: Brittany Roman Unijb'd Date: 08/07/2024 Age:24 hours Reason for consult: Initial assessment;Primapara;Term  P1. Mom was holding baby close to her breast when LC came in. Asked mom if she was still BF mom stated she hadn't been long came off. Noted baby cueing sticking out her tongue and searching. Suggested mom put baby back to the breast those are cueing signs she wants to feed more. Mom did and baby nursing well when LC left. Mom has good everted nipples, baby has no trouble latching. Encouraged mom to assess for transfer before and after feeding. Newborn feeding habits, STS, I&O, positioning, body alignment reviewed. Mom encouraged to feed baby 8-12 times/24 hours and with feeding cues.  Mom denies painful latches.encouraged keeping cheeks to breast. Baby has wide flange.  Encouraged mom to call for assistance or questions. Praised mom for great feedings. Maternal Data Has patient been taught Hand Expression?: Yes Does the patient have breastfeeding experience prior to this delivery?: No  Feeding    LATCH Score Latch: Grasps breast easily, tongue down, lips flanged, rhythmical sucking.  Audible Swallowing: A few with stimulation  Type of Nipple: Everted at rest and after stimulation  Comfort (Breast/Nipple): Soft / non-tender  Hold (Positioning): No assistance needed to correctly position infant at breast.  LATCH Score: 9   Lactation Tools Discussed/Used    Interventions Interventions: Breast feeding basics reviewed;Assisted with latch;Skin to skin;Breast massage;Hand express;Breast compression;Education;LC Services brochure  Discharge Discharge Education: Outpatient recommendation Pump: DEBP (Spectra Judd)  Consult Status Consult Status: Follow-up Date: 08/08/24 Follow-up type: In-patient    Tekeshia Klahr G 08/07/2024, 11:33 PM

## 2024-08-08 NOTE — Patient Instructions (Signed)

## 2024-08-08 NOTE — Social Work (Signed)
 CSW received consult for hx of Anxiety.  CSW met with MOB to offer support and complete assessment. CSW entered the room ad observed MOB and FOB standing at bedside and the infant in the bassinet. CSW introduced self, CSW role and reason for visit,  MOB was agreeable to visit  and allowed FOB to remain in the room. CSW inquired about how MOB was feeling, MOB reported good. CSW inquired about MOB MH hx, MOB reported she has never been diagnosed with anxiety but experiences situational anxiety. MOB reported her symptoms are manageable and she talks to FOB with cope. CSW provided education regarding the baby blues period vs. perinatal mood disorders, discussed treatment and gave resources for mental health follow up if concerns arise.  CSW recommends self-evaluation during the postpartum time period using the New Mom Checklist from Postpartum Progress and encouraged MOB to contact a medical professional if symptoms are noted at any time.  MOB identified FOB, her mom ans his parents as her primary supports.   CSW provided review of Sudden Infant Death Syndrome (SIDS) precautions.  MOB reported she has all essential items for the infant including a bassinet and car seat. CSW identifies no further need for intervention and no barriers to discharge at this time.  Brittany Roman, LCSWA Clinical Social Worker [phone]

## 2024-08-08 NOTE — Progress Notes (Addendum)
 Post Partum Day 1 Subjective:  Brittany Roman is a 23 y.o. G2P1011 [redacted]w[redacted]d s/p SVD of 3490 g female complicated by 1st degree labial tear and post-delivery maternal fever which resolved spontaneously without abx.  No acute events overnight.  Pt denies problems with ambulating, voiding or po intake.  She denies nausea or vomiting.  Pain is well controlled.  She has had flatus.  Lochia Minimal.  Plan for birth control is condoms.  Method of Feeding: Breast  Objective: Blood pressure 123/81, pulse 76, temperature 98.3 F (36.8 C), temperature source Oral, resp. rate 18, last menstrual period 09/12/2023, SpO2 100%, unknown if currently breastfeeding.  Physical Exam:  General: alert, cooperative and no distress Lochia:normal flow Chest: normal WOB Heart: Regular rate Abdomen: +BS, soft, mild TTP (appropriate) Uterine Fundus: firm, nontender Extremities: No peripheral edema  Recent Labs    08/07/24 0538  HGB 11.4*  HCT 33.4*    Assessment/Plan:  ASSESSMENT: Brittany Roman is a 23 y.o. G2P1011 PPD1 [redacted]w[redacted]d s/p SVD of 3490 g female complicated by 1st degree labial tear and post-delivery maternal fever which resolved spontaneously without abx. Staying for breastfeeding support Discharge home tomorrow Continue routine PP care Breastfeeding support PRN  LOS: 1 day   Nunzio DELENA Cena Verma 08/08/2024, 11:24 AM   GME ATTESTATION:  Evaluation and management procedures were performed by the [facility] Medicine Resident under my supervision. I was immediately available for direct supervision, assistance and direction throughout this encounter.  I also confirm that I have verified the information documented in the resident's note, and that I have also personally reperformed the pertinent components of the physical exam and all of the medical decision making activities.  I have also made any necessary editorial changes.  Leeroy KATHEE Pouch, MD OB Fellow, Faculty Practice [facility], Center for [facility]  Healthcare 08/08/2024 12:56 PM

## 2024-08-08 NOTE — Lactation Note (Signed)
 This note was copied from a baby's chart. Lactation Consultation Note  Patient Name: Brittany Roman Date: 08/08/2024 Age:24 hours Reason for consult: Follow-up assessment;Mother's request;Primapara;1st time breastfeeding;Term;Nipple pain/trauma;Breastfeeding assistance  P1- MOB called out for assistance reporting that infant is cluster feeding and is starting to make her nipples sore. When LC entered the room, infant was latched to the left breast and was chomping. Infant was trying to sleep and not wanting to eat. LC reviewed with MOB how to tell the difference between chomping and nutritive sucking. LC changed infant's diaper, swaddled her tightly and infant fell asleep instantly. LC encouraged MOB to call for further assistance as needed.  Maternal Data Has patient been taught Hand Expression?: No Does the patient have breastfeeding experience prior to this delivery?: No  Feeding Mother's Current Feeding Choice: Breast Milk  Lactation Tools Discussed/Used Pump Education: Milk Storage  Interventions Interventions: Breast feeding basics reviewed;Education;LC Services brochure  Discharge Discharge Education: Engorgement and breast care;Warning signs for feeding baby Pump: DEBP;Personal  Consult Status Consult Status: Follow-up Date: 08/09/24 Follow-up type: In-patient    Recardo Hoit BS, IBCLC 08/08/2024, 10:49 PM

## 2024-08-09 ENCOUNTER — Encounter: Admitting: Obstetrics and Gynecology

## 2024-08-09 ENCOUNTER — Other Ambulatory Visit (HOSPITAL_COMMUNITY): Payer: Self-pay

## 2024-08-09 MED ORDER — SENNOSIDES-DOCUSATE SODIUM 8.6-50 MG PO TABS
2.0000 | ORAL_TABLET | Freq: Every evening | ORAL | 0 refills | Status: AC | PRN
  Filled 2024-08-09: qty 20, 10d supply, fill #0

## 2024-08-09 MED ORDER — IBUPROFEN 600 MG PO TABS
600.0000 mg | ORAL_TABLET | Freq: Four times a day (QID) | ORAL | 0 refills | Status: AC | PRN
  Filled 2024-08-09: qty 30, 8d supply, fill #0

## 2024-08-10 ENCOUNTER — Inpatient Hospital Stay (HOSPITAL_COMMUNITY)

## 2024-08-10 ENCOUNTER — Inpatient Hospital Stay (HOSPITAL_COMMUNITY): Admission: RE | Admit: 2024-08-10 | Source: Home / Self Care | Admitting: Family Medicine

## 2024-08-23 ENCOUNTER — Telehealth (HOSPITAL_COMMUNITY): Payer: Self-pay | Admitting: *Deleted

## 2024-08-23 NOTE — Telephone Encounter (Signed)
 08/23/2024  Name: Brittany Roman MRN: [id number] DOB: November 19, 2000  Reason for Call:  Transition of Care Hospital Discharge Call  Contact Status: Patient Contact Status: Complete  Language assistant needed:          Follow-Up Questions: Do You Have Any Concerns About Your Health As You Heal From Delivery?: No Do You Have Any Concerns About Your Infants Health?: No  Edinburgh Postnatal Depression Scale:  In the Past 7 Days: Patient declined EPDS at this time. Stated, If anything, my mood is better now than when I was pregnant.   PHQ2-9 Depression Scale:     Discharge Follow-up: Edinburgh score requires follow up?: N/A Patient was advised of the following resources:: Breastfeeding Support Group, Support Group Requested email information - sent by RN. Post-discharge interventions: Reviewed Newborn Safe Sleep Practices  Signature Allean IVAR Carton, RN, 08/23/24, [phone]

## 2024-09-06 ENCOUNTER — Other Ambulatory Visit: Payer: Self-pay

## 2024-09-06 ENCOUNTER — Ambulatory Visit: Admitting: Certified Nurse Midwife

## 2024-09-06 DIAGNOSIS — Z124 Encounter for screening for malignant neoplasm of cervix: Secondary | ICD-10-CM

## 2024-09-06 NOTE — Progress Notes (Signed)
 Post Partum Visit Note  Brittany Roman is a 23 y.o. G40P1011 female who presents for a postpartum visit. She is 4.2 weeks postpartum following a normal spontaneous vaginal delivery.  I have fully reviewed the prenatal and intrapartum course. The delivery was at 40.4 gestational weeks.  Anesthesia: epidural. Postpartum course has been uncomplicated. Baby is doing well. Baby is feeding by breast. Bleeding staining only. Bowel function is normal. Bladder function is normal. Patient is not sexually active. Contraception method is condoms. Postpartum depression screening: negative.   The pregnancy intention screening data noted above was reviewed. Potential methods of contraception were discussed. The patient elected to proceed with No data recorded.   Edinburgh Postnatal Depression Scale - 09/06/24 1115       Edinburgh Postnatal Depression Scale:  In the Past 7 Days   I have been able to laugh and see the funny side of things. 0    I have looked forward with enjoyment to things. 0    I have blamed myself unnecessarily when things went wrong. 0    I have been anxious or worried for no good reason. 0    I have felt scared or panicky for no good reason. 0    Things have been getting on top of me. 0    I have been so unhappy that I have had difficulty sleeping. 0    I have felt sad or miserable. 0    I have been so unhappy that I have been crying. 0    The thought of harming myself has occurred to me. 0    Edinburgh Postnatal Depression Scale Total 0          Health Maintenance Due  Topic Date Due   HPV VACCINES (1 - 3-dose series) Never done   Hepatitis B Vaccines 19-59 Average Risk (1 of 3 - 19+ 3-dose series) Never done   Cervical Cancer Screening (Pap smear)  Never done   COVID-19 Vaccine (3 - 2025-26 season) 06/06/2024    The following portions of the patient's history were reviewed and updated as appropriate: allergies, current medications, past family history, past medical  history, past social history, past surgical history, and problem list.  Review of Systems Pertinent items are noted in HPI.  Objective:  BP 120/86   Pulse (!) 115   Wt 109 lb (49.4 kg)   LMP 09/12/2023 (Exact Date)   Breastfeeding Yes   BMI 19.31 kg/m    General:  alert, cooperative, appears stated age, and no distress   Breasts:  not indicated  Lungs: clear to auscultation bilaterally  Heart:  regular rate and rhythm, S1, S2 normal, no murmur, click, rub or gallop  Abdomen: soft, non-tender; bowel sounds normal; no masses,  no organomegaly   Wound NA  GU exam:  not indicated       Assessment:    1. Postpartum care and examination - Doing well PP.  - Minimal bleeding.    Benign postpartum exam.   Plan:   Essential components of care per ACOG recommendations:  1.  Mood and well being: Patient with negative depression screening today. Reviewed local resources for support.  - Patient tobacco use? No.   - hx of drug use? No.    2. Infant care and feeding:  -Patient currently breastmilk feeding? Yes. Reviewed importance of draining breast regularly to support lactation.  -Social determinants of health (SDOH) reviewed in EPIC. No concerns  3. Sexuality, contraception and birth spacing -  Patient does not want a pregnancy in the next year.  Desired family size is 2 children.  - Reviewed reproductive life planning. Reviewed contraceptive methods based on pt preferences and effectiveness.  Patient desired Female Condom today.   - Discussed birth spacing of 18 months  4. Sleep and fatigue -Encouraged family/partner/community support of 4 hrs of uninterrupted sleep to help with mood and fatigue  5. Physical Recovery  - Discussed patients delivery and complications. She describes her labor as good. - Patient had a Vaginal, no problems at delivery. Patient had a 1st degree laceration. Perineal healing reviewed. Patient expressed understanding - Patient has urinary incontinence?  No. - Patient is safe to resume physical and sexual activity  6.  Health Maintenance - HM due items addressed Yes - Last pap smear No results found for: DIAGPAP Pap smear not done at today's visit.  -Breast Cancer screening indicated? No.   7. Chronic Disease/Pregnancy Condition follow up: None  - PCP follow up  Camie DELENA Rote, CNM [facility] [employer], [facility]

## 2024-10-27 ENCOUNTER — Telehealth: Payer: Self-pay | Admitting: Family Medicine

## 2024-10-27 NOTE — Telephone Encounter (Signed)
 Patent is calling because she still feels bruised and pain in the area she gave birth she is wanting to know if she should make an appointment to come in and be checked or would like advise on what she can do

## 2024-10-27 NOTE — Telephone Encounter (Signed)
 Called pt and discussed her concern. She stated that she believes her discomfort is from her pelvic floor. She reports that when she sits on the toilet her pelvis is sore - feels like bruising. Pt would like to be seen by any provider for evaluation of this issue. I advised that an appointment will be scheduled which she can view via Mychart. She voiced understanding.

## 2024-11-01 ENCOUNTER — Ambulatory Visit: Payer: Self-pay

## 2024-11-01 ENCOUNTER — Emergency Department (HOSPITAL_BASED_OUTPATIENT_CLINIC_OR_DEPARTMENT_OTHER): Admitting: Radiology

## 2024-11-01 ENCOUNTER — Emergency Department (HOSPITAL_BASED_OUTPATIENT_CLINIC_OR_DEPARTMENT_OTHER)
Admission: EM | Admit: 2024-11-01 | Discharge: 2024-11-01 | Disposition: A | Discharge status: Home / Self Care | Source: Ambulatory Visit

## 2024-11-01 ENCOUNTER — Other Ambulatory Visit: Payer: Self-pay

## 2024-11-01 ENCOUNTER — Encounter (HOSPITAL_BASED_OUTPATIENT_CLINIC_OR_DEPARTMENT_OTHER): Payer: Self-pay

## 2024-11-01 DIAGNOSIS — R5383 Other fatigue: Secondary | ICD-10-CM | POA: Insufficient documentation

## 2024-11-01 DIAGNOSIS — R0789 Other chest pain: Secondary | ICD-10-CM | POA: Diagnosis not present

## 2024-11-01 DIAGNOSIS — R42 Dizziness and giddiness: Secondary | ICD-10-CM | POA: Diagnosis present

## 2024-11-01 LAB — BASIC METABOLIC PANEL WITH GFR
Anion gap: 16 — ABNORMAL HIGH (ref 5–15)
BUN: 14 mg/dL (ref 6–20)
CO2: 21 mmol/L — ABNORMAL LOW (ref 22–32)
Calcium: 10.2 mg/dL (ref 8.9–10.3)
Chloride: 101 mmol/L (ref 98–111)
Creatinine, Ser: 0.67 mg/dL (ref 0.44–1.00)
GFR, Estimated: >60 mL/min
Glucose, Bld: 95 mg/dL (ref 70–99)
Potassium: 3.6 mmol/L (ref 3.5–5.1)
Sodium: 138 mmol/L (ref 135–145)

## 2024-11-01 LAB — PREGNANCY, URINE: Preg Test, Ur: NEGATIVE

## 2024-11-01 LAB — CBC
HCT: 38.3 % (ref 36.0–46.0)
Hemoglobin: 13.2 g/dL (ref 12.0–15.0)
MCH: 29.8 pg (ref 26.0–34.0)
MCHC: 34.5 g/dL (ref 30.0–36.0)
MCV: 86.5 fL (ref 80.0–100.0)
Platelets: 261 10*3/uL (ref 150–400)
RBC: 4.43 MIL/uL (ref 3.87–5.11)
RDW: 12.1 % (ref 11.5–15.5)
WBC: 6.2 10*3/uL (ref 4.0–10.5)
nRBC: 0 % (ref 0.0–0.2)

## 2024-11-01 LAB — TROPONIN T, HIGH SENSITIVITY: Troponin T High Sensitivity: <6 ng/L (ref 0–19)

## 2024-11-01 NOTE — Telephone Encounter (Signed)
 FYI Only or Action Required?: FYI only for provider: Pt will go to womens or an UC for care.  Patient was last seen in primary care on .  Called Nurse Triage reporting Headache. dizziness  Symptoms began several weeks ago.  Interventions attempted: Nothing.  Symptoms are: unchanged.  Triage Disposition: See Physician Within 24 Hours  Patient/caregiver understands and will follow disposition?: Yes                    Summary: Headache, dizzness   Reason for Triage: Patient called because she is experiencing light head ness and some headaches. Now she is having right ear pain. She is breastfeeding and is not sure if that could be a cause of it. She is only experiencing pain from her headache and ear. This has been going on about two weeks. She has starting drinking more water and food but its not helping she thought she was just dehydrated.     Reason for Disposition  [1] MILD-MODERATE headache (e.g., pain scale 1-7) AND [2] after spinal (epidural) anesthesia or spinal tap (lumbar puncture)  Answer Assessment - Initial Assessment Questions 1. LOCATION: Where does it hurt?      head 2. ONSET: When did the headache start? (e.g., minutes, hours or days)      2 weeks 3. PATTERN: Does the pain come and go, or has it been constant since it started?     Comes and goes 4. SEVERITY: How bad is the pain? and What does it keep you from doing?  (e.g., Scale 1-10; mild, moderate, or severe)     moderate 5. RECURRENT SYMPTOM: Have you ever had headaches before? If Yes, ask: When was the last time? and What happened that time?      Did not ask 6. CAUSE: What do you think is causing the headache?     unsure 7. MIGRAINE: Have you been diagnosed with migraine headaches? If Yes, ask: Is this headache similar?      Did not ask 8. PREECLAMPSIA - HYPERTENSION:  Were you diagnosed with preeclampsia during your pregnancy?       no 9. HEAD INJURY: Has there been  any recent injury to your head?      Did not ask 10. OTHER SYMPTOMS: Do you have any other symptoms? (e.g., fever, stiff neck, blurred vision; swelling of hands, face, or feet; feeling anxious, depressed, overwhelmed, or sad)       Dizziness and ear pain 11. DELIVERY DATE: When was your delivery date? Vaginal delivery or C-section? Did you have a spinal (epidural) anesthesia during childbirth?       10 weeks ago  Protocols used: Postpartum - Headache-A-AH

## 2024-11-01 NOTE — Discharge Instructions (Addendum)
 Increase your fluid intake and make sure you are eating a balanced diet.  Follow-up with your OB/GYN and primary care doctor as needed.

## 2024-11-01 NOTE — ED Triage Notes (Signed)
 Pt reports being 3 months postpartum and having mild intermittent lightheadedness. Hx of anemia during pregnancy. Pt went in UC today where an EKG was done and she was told it was abnormal and mimicking a heart attack.   Pt denies CP/SOB. No family history of cardiac issues. Pt awake and alert, NAD noted in triage.

## 2024-11-01 NOTE — ED Provider Notes (Signed)
 "  EMERGENCY DEPARTMENT AT [facility] Provider Note   CSN: [id number] Arrival date & time: 11/01/24  8084     Patient presents with: Dizziness   Brittany Roman is a 24 y.o. female.   24 year old female presents for evaluation of lightheadedness.  States she gets worse when she stands up.  States she was at urgent care earlier today and they told her she could be having a heart attack and told her to come to the ER.  She is 3 months postpartum.  She is not having any chest pain or shortness of breath.  States she feels more fatigued with activity throughout the day.  She states she is breast-feeding and thinks she may not be eating enough or drinking enough water.  Denies any other symptoms or concerns.   Dizziness Associated symptoms: no chest pain, no palpitations, no shortness of breath and no vomiting        Prior to Admission medications  Medication Sig Start Date End Date Taking? Authorizing Provider  ibuprofen  (ADVIL ) 600 MG tablet Take 1 tablet (600 mg total) by mouth every 6 (six) hours as needed. Patient not taking: Reported on 09/06/2024 08/09/24   Loreli Suzen BIRCH, CNM  Prenatal Vit-Fe Fumarate-FA (PRENATAL VITAMIN PO) Take 1 tablet by mouth daily at 6 (six) AM.    [provider]  senna-docusate (SENOKOT-S) 8.6-50 MG tablet Take 2 tablets by mouth at bedtime as needed for mild constipation. 08/09/24   Loreli Suzen BIRCH, CNM    Allergies: Patient has no known allergies.    Review of Systems  Constitutional:  Negative for chills and fever.  HENT:  Negative for ear pain and sore throat.   Eyes:  Negative for pain and visual disturbance.  Respiratory:  Negative for cough and shortness of breath.   Cardiovascular:  Negative for chest pain and palpitations.  Gastrointestinal:  Negative for abdominal pain and vomiting.  Genitourinary:  Negative for dysuria and hematuria.  Musculoskeletal:  Negative for arthralgias and back pain.  Skin:  Negative for  color change and rash.  Neurological:  Positive for dizziness. Negative for seizures and syncope.  All other systems reviewed and are negative.   Updated Vital Signs BP (!) 140/76 (BP Location: Right Arm)   Pulse 97   Temp 98.7 F (37.1 C) (Oral)   Resp 18   Ht 5' 3 (1.6 m)   Wt 47.2 kg   SpO2 98%   BMI 18.42 kg/m   Physical Exam Vitals and nursing note reviewed.  Constitutional:      General: She is not in acute distress.    Appearance: Normal appearance. She is well-developed. She is not ill-appearing.  HENT:     Head: Normocephalic and atraumatic.  Eyes:     Conjunctiva/sclera: Conjunctivae normal.  Cardiovascular:     Rate and Rhythm: Normal rate and regular rhythm.     Heart sounds: No murmur heard. Pulmonary:     Effort: Pulmonary effort is normal. No respiratory distress.     Breath sounds: Normal breath sounds.  Abdominal:     Palpations: Abdomen is soft.     Tenderness: There is no abdominal tenderness.  Musculoskeletal:        General: No swelling.     Cervical back: Neck supple.  Skin:    General: Skin is warm and dry.     Capillary Refill: Capillary refill takes less than 2 seconds.  Neurological:     General: No focal deficit present.  Mental Status: She is alert.  Psychiatric:        Mood and Affect: Mood normal.     (all labs ordered are listed, but only abnormal results are displayed) Labs Reviewed  BASIC METABOLIC PANEL WITH GFR - Abnormal; Notable for the following components:      Result Value   CO2 21 (*)    Anion gap 16 (*)    All other components within normal limits  CBC  PREGNANCY, URINE  TROPONIN T, HIGH SENSITIVITY    EKG: EKG Interpretation Date/Time:  Tuesday November 01 2024 19:25:07 EST Ventricular Rate:  98 PR Interval:  134 QRS Duration:  85 QT Interval:  338 QTC Calculation: 432 R Axis:   84  Text Interpretation: Sinus rhythm Borderline repolarization abnormality No previous for comparison Confirmed by  Gennaro Bouchard ([id number]) on 11/01/2024 8:08:09 PM  Radiology: DG Chest 2 View Result Date: 11/01/2024 EXAM: 2 VIEW(S) XRAY OF THE CHEST 11/01/2024 07:42:00 PM COMPARISON: None available. CLINICAL HISTORY: Lightheadedness. FINDINGS: LUNGS AND PLEURA: No focal pulmonary opacity. No pleural effusion. No pneumothorax. HEART AND MEDIASTINUM: No acute abnormality of the cardiac and mediastinal silhouettes. BONES AND SOFT TISSUES: Mild thoracolumbar scoliosis. IMPRESSION: 1. No acute findings. Electronically signed by: Rogelia Myers MD 11/01/2024 08:00 PM EST RP Workstation: [id number]     Procedures   Medications Ordered in the ED - No data to display                                  Medical Decision Making Patient here for pulses of lightheadedness that seem to happen upon standing.  Advised to increase her fluid intake and eat a balanced diet.  She is breast-feeding and recently delivered about 3 months ago.  EKG, labs and x-ray all appear within normal limits and vitals are stable.  She has a follow-up with OB/GYN next week.  Advised to follow-up with primary care doctor as well and to return to the ER for any new or worsening symptoms.  She feels comfortable with the plan to be discharged home.  Problems Addressed: Atypical chest pain: acute illness or injury Orthostatic lightheadedness: acute illness or injury  Amount and/or Complexity of Data Reviewed External Data Reviewed: notes.    Details: Hospital records reviewed and patient is 3 months postpartum Labs: ordered. Decision-making details documented in ED Course.    Details: Ordered and reviewed by me and unremarkable Radiology: ordered and independent interpretation performed. Decision-making details documented in ED Course.    Details: Ordered and interpreted by me independently of radiology Chest x-ray: Shows no acute abnormality ECG/medicine tests: ordered and independent interpretation performed. Decision-making details  documented in ED Course.    Details: Ordered and interpreted by me in the absence of cardiology and shows sinus rhythm, no STEMI, or significant change when compared to prior EKG  Risk OTC drugs. Prescription drug management.     Final diagnoses:  Orthostatic lightheadedness  Atypical chest pain    ED Discharge Orders     None          Gennaro Bouchard CROME, DO 11/01/24 2253  "

## 2024-11-01 NOTE — ED Notes (Signed)
 Reviewed AVS/discharge instructions with patient. Time allotted for and all questions answered. Patient is agreeable for d/c and escorted to ED exit by staff.

## 2024-11-01 NOTE — ED Triage Notes (Signed)
 Patient given 1 baby aspirin at [facility].

## 2024-11-02 ENCOUNTER — Ambulatory Visit: Payer: Self-pay

## 2024-11-02 NOTE — Telephone Encounter (Signed)
 FYI Only or Action Required?: Action required by provider: lab or test result follow-up needed.  Patient was last seen in primary care on .  Called Nurse Triage reporting Lab results.  Symptoms began yesterday.  Interventions attempted: Nothing.  Symptoms are: stable. Pt. Given lab results. Will call pediatrician for breast feeding question.  Triage Disposition: Call PCP Now  Patient/caregiver understands and will follow disposition?: Yes      Copied from CRM #[id number]. Topic: Clinical - Medical Advice >> Nov 02, 2024  9:27 AM Alfonso ORN wrote: Reason for CRM: needing medical advice reason patient went to  urgent care due to light headed and was prescribe high dosage of asprin , provider said she was having an heart attacked, which patient was not  Patient need advice want to know when and if ok to breast feed her baby due to her blood work results and also would like to go over her blood work result need to clarify the results      ----------------------------------------------------------------------- From previous Reason for Contact - Pink Word Triage: Pink Word that prompted message to Nurse Triage:   Please send message to Nurse Triage by clicking Send Message and Close CRM. Reason for Disposition  [1] Follow-up call from patient regarding patient's clinical status AND [2] information urgent  Answer Assessment - Initial Assessment Questions 1. REASON FOR CALL or QUESTION: What is your reason for calling today? or How can I best     Lab results given 2. CALLER: Document the source of call. (e.g., laboratory staff, caregiver or patient).     Patient. Has breast feeding question, will call pediatrician's office.  Protocols used: PCP Call - No Triage-A-AH

## 2024-11-10 ENCOUNTER — Ambulatory Visit: Payer: Self-pay | Admitting: Obstetrics & Gynecology

## 2024-11-10 ENCOUNTER — Other Ambulatory Visit: Payer: Self-pay

## 2024-11-10 VITALS — BP 116/77 | HR 108 | Wt 102.0 lb

## 2024-11-10 DIAGNOSIS — M6289 Other specified disorders of muscle: Secondary | ICD-10-CM

## 2024-11-10 NOTE — Progress Notes (Signed)
" ° °  GYNECOLOGY OFFICE VISIT NOTE  History:  Brittany Roman is a 24 y.o. G2P1011 here today for discussion and management of concerns about pelvic floor issues.   Reports having pain when she does any Kegel or she is using the bathroom.  This pain only occurs in these situations.  No pain at rest or during intercourse.  No dysuria.  She denies any abnormal vaginal discharge, bleeding, pelvic pain or other concerns.  Past Medical History:  Diagnosis Date   Anxiety     Past Surgical History:  Procedure Laterality Date   TONSILLECTOMY      The following portions of the patient's history were reviewed and updated as appropriate: allergies, current medications, past family history, past medical history, past social history, past surgical history and problem list.   Health Maintenance:  Normal pap last years (we are getting records)   Review of Systems:  Pertinent items noted in HPI and remainder of comprehensive ROS otherwise negative.  Physical Exam:  BP 116/77   Pulse (!) 108   Wt 102 lb (46.3 kg)   Breastfeeding Yes   BMI 18.07 kg/m  CONSTITUTIONAL: Well-developed, well-nourished female in no acute distress.  SKIN: No rash noted. Not diaphoretic. No erythema. No pallor. MUSCULOSKELETAL: Normal range of motion. No edema noted. NEUROLOGIC: Alert and oriented to person, place, and time. Normal muscle tone coordination. No cranial nerve deficit noted on observation. PSYCHIATRIC: Normal mood and affect. Normal behavior. Normal judgment and thought content. CARDIOVASCULAR: Normal heart rate noted RESPIRATORY: Effort and breath sounds normal, no problems with respiration noted ABDOMEN: No masses or other overt distention noted on observation. No tenderness.   PELVIC: Deferred   Assessment and Plan:     1. Pelvic floor dysfunction in female (Primary) Patient has postpartum pelvic floor dysfunction, pelvic floor therapy recommended. Referral made to Physical Therapy. Offered muscle  relaxants as needed, she deferred for now. - Ambulatory referral to Physical Therapy   Return for any gynecologic concerns.    I spent 25 minutes dedicated to the care of this patient including pre-visit review of records, face to face time with the patient discussing her conditions and treatments, post visit ordering of medications and appropriate tests or procedures, coordinating care and documenting this visit encounter.    GLORIS HUGGER, MD, FACOG Obstetrician & Gynecologist, [facility] for [employer], [facility]       "
